# Patient Record
Sex: Male | Born: 1955
Health system: Southern US, Community
[De-identification: ages and names within clinical notes are randomized; demographics above are authoritative.]

## PROBLEM LIST (undated history)

## (undated) DIAGNOSIS — L309 Dermatitis, unspecified: Secondary | ICD-10-CM

## (undated) DIAGNOSIS — Z720 Tobacco use: Secondary | ICD-10-CM

## (undated) DIAGNOSIS — J449 Chronic obstructive pulmonary disease, unspecified: Secondary | ICD-10-CM

## (undated) DIAGNOSIS — F819 Developmental disorder of scholastic skills, unspecified: Secondary | ICD-10-CM

## (undated) DIAGNOSIS — K716 Toxic liver disease with hepatitis, not elsewhere classified: Secondary | ICD-10-CM

## (undated) DIAGNOSIS — G4730 Sleep apnea, unspecified: Secondary | ICD-10-CM

## (undated) DIAGNOSIS — T50905A Adverse effect of unspecified drugs, medicaments and biological substances, initial encounter: Secondary | ICD-10-CM

## (undated) DIAGNOSIS — E785 Hyperlipidemia, unspecified: Secondary | ICD-10-CM

## (undated) DIAGNOSIS — I10 Essential (primary) hypertension: Secondary | ICD-10-CM

## (undated) HISTORY — DX: Dermatitis, unspecified: L30.9

## (undated) HISTORY — DX: Adverse effect of unspecified drugs, medicaments and biological substances, initial encounter: T50.905A

## (undated) HISTORY — DX: Sleep apnea, unspecified: G47.30

## (undated) HISTORY — DX: Tobacco use: Z72.0

## (undated) HISTORY — DX: Chronic obstructive pulmonary disease, unspecified: J44.9

## (undated) HISTORY — DX: Developmental disorder of scholastic skills, unspecified: F81.9

## (undated) HISTORY — DX: Toxic liver disease with hepatitis, not elsewhere classified: K71.6

## (undated) HISTORY — DX: Essential (primary) hypertension: I10

## (undated) HISTORY — DX: Hyperlipidemia, unspecified: E78.5

---

## 1999-04-12 ENCOUNTER — Emergency Department (HOSPITAL_COMMUNITY): Admission: EM | Admit: 1999-04-12 | Discharge: 1999-04-12 | Payer: Self-pay | Admitting: Emergency Medicine

## 1999-04-12 ENCOUNTER — Encounter: Payer: Self-pay | Admitting: Emergency Medicine

## 1999-10-25 ENCOUNTER — Emergency Department (HOSPITAL_COMMUNITY): Admission: EM | Admit: 1999-10-25 | Discharge: 1999-10-25 | Payer: Self-pay | Admitting: Emergency Medicine

## 2001-01-02 ENCOUNTER — Emergency Department (HOSPITAL_COMMUNITY): Admission: EM | Admit: 2001-01-02 | Discharge: 2001-01-02 | Payer: Self-pay | Admitting: Emergency Medicine

## 2001-01-18 ENCOUNTER — Emergency Department (HOSPITAL_COMMUNITY): Admission: EM | Admit: 2001-01-18 | Discharge: 2001-01-18 | Payer: Self-pay | Admitting: Emergency Medicine

## 2001-01-29 ENCOUNTER — Encounter: Admission: RE | Admit: 2001-01-29 | Discharge: 2001-01-29 | Payer: Self-pay | Admitting: Sports Medicine

## 2003-04-14 ENCOUNTER — Emergency Department (HOSPITAL_COMMUNITY): Admission: EM | Admit: 2003-04-14 | Discharge: 2003-04-14 | Payer: Self-pay | Admitting: Emergency Medicine

## 2003-04-27 ENCOUNTER — Encounter: Admission: RE | Admit: 2003-04-27 | Discharge: 2003-04-27 | Payer: Self-pay | Admitting: Internal Medicine

## 2003-05-18 ENCOUNTER — Encounter: Admission: RE | Admit: 2003-05-18 | Discharge: 2003-05-18 | Payer: Self-pay | Admitting: Internal Medicine

## 2003-05-18 ENCOUNTER — Ambulatory Visit (HOSPITAL_COMMUNITY): Admission: RE | Admit: 2003-05-18 | Discharge: 2003-05-18 | Payer: Self-pay | Admitting: Internal Medicine

## 2003-06-18 ENCOUNTER — Encounter: Admission: RE | Admit: 2003-06-18 | Discharge: 2003-06-18 | Payer: Self-pay | Admitting: Internal Medicine

## 2003-07-15 ENCOUNTER — Encounter: Admission: RE | Admit: 2003-07-15 | Discharge: 2003-07-15 | Payer: Self-pay | Admitting: Internal Medicine

## 2003-10-28 ENCOUNTER — Encounter: Admission: RE | Admit: 2003-10-28 | Discharge: 2003-10-28 | Payer: Self-pay | Admitting: Internal Medicine

## 2003-11-10 ENCOUNTER — Emergency Department (HOSPITAL_COMMUNITY): Admission: AD | Admit: 2003-11-10 | Discharge: 2003-11-10 | Payer: Self-pay | Admitting: Family Medicine

## 2003-11-18 ENCOUNTER — Encounter: Admission: RE | Admit: 2003-11-18 | Discharge: 2003-11-18 | Payer: Self-pay | Admitting: Internal Medicine

## 2003-12-04 ENCOUNTER — Encounter: Admission: RE | Admit: 2003-12-04 | Discharge: 2003-12-04 | Payer: Self-pay | Admitting: Internal Medicine

## 2004-04-14 ENCOUNTER — Encounter: Admission: RE | Admit: 2004-04-14 | Discharge: 2004-04-14 | Payer: Self-pay | Admitting: Internal Medicine

## 2004-04-14 ENCOUNTER — Ambulatory Visit (HOSPITAL_COMMUNITY): Admission: RE | Admit: 2004-04-14 | Discharge: 2004-04-14 | Payer: Self-pay | Admitting: Internal Medicine

## 2004-06-14 ENCOUNTER — Encounter: Admission: RE | Admit: 2004-06-14 | Discharge: 2004-06-14 | Payer: Self-pay | Admitting: Internal Medicine

## 2004-06-14 ENCOUNTER — Ambulatory Visit (HOSPITAL_COMMUNITY): Admission: RE | Admit: 2004-06-14 | Discharge: 2004-06-14 | Payer: Self-pay | Admitting: Internal Medicine

## 2004-06-15 ENCOUNTER — Encounter: Admission: RE | Admit: 2004-06-15 | Discharge: 2004-06-15 | Payer: Self-pay | Admitting: Internal Medicine

## 2004-06-21 IMAGING — CR DG HIP W/ PELVIS BILAT
5 series · 5 of 5 positions shown · non-contrast
Comparison: none

CLINICAL DATA: 47-year-old male with bilateral hip pain, right greater than left.  No known injury. 
 BILATERAL HIPS WITH PELVIS (FIVE VIEWS)

[view not recorded (1 of 5)]
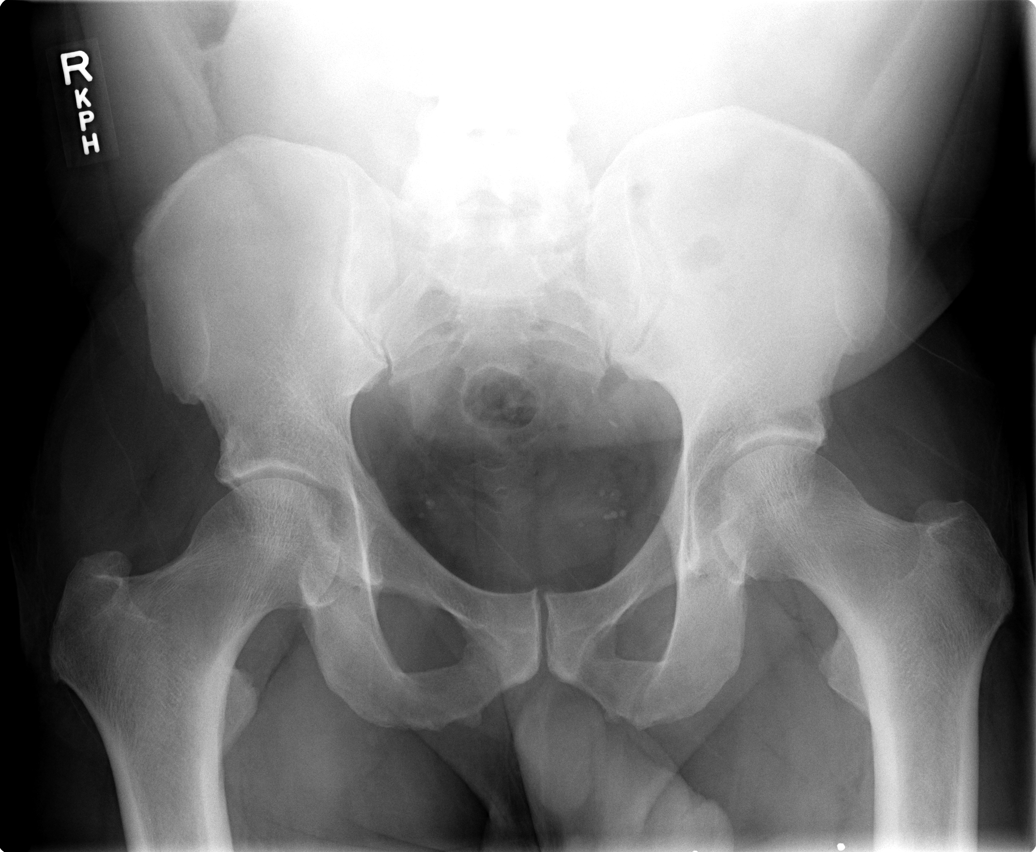

[view not recorded (2 of 5)]
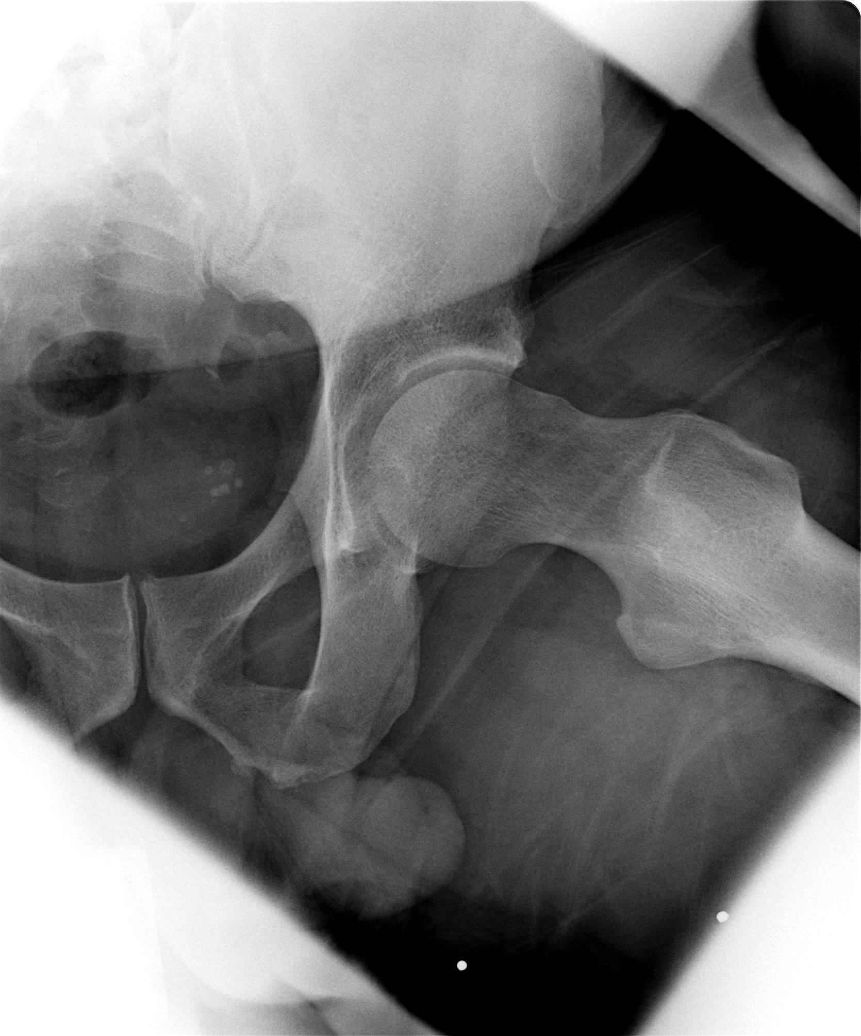

[view not recorded (3 of 5)]
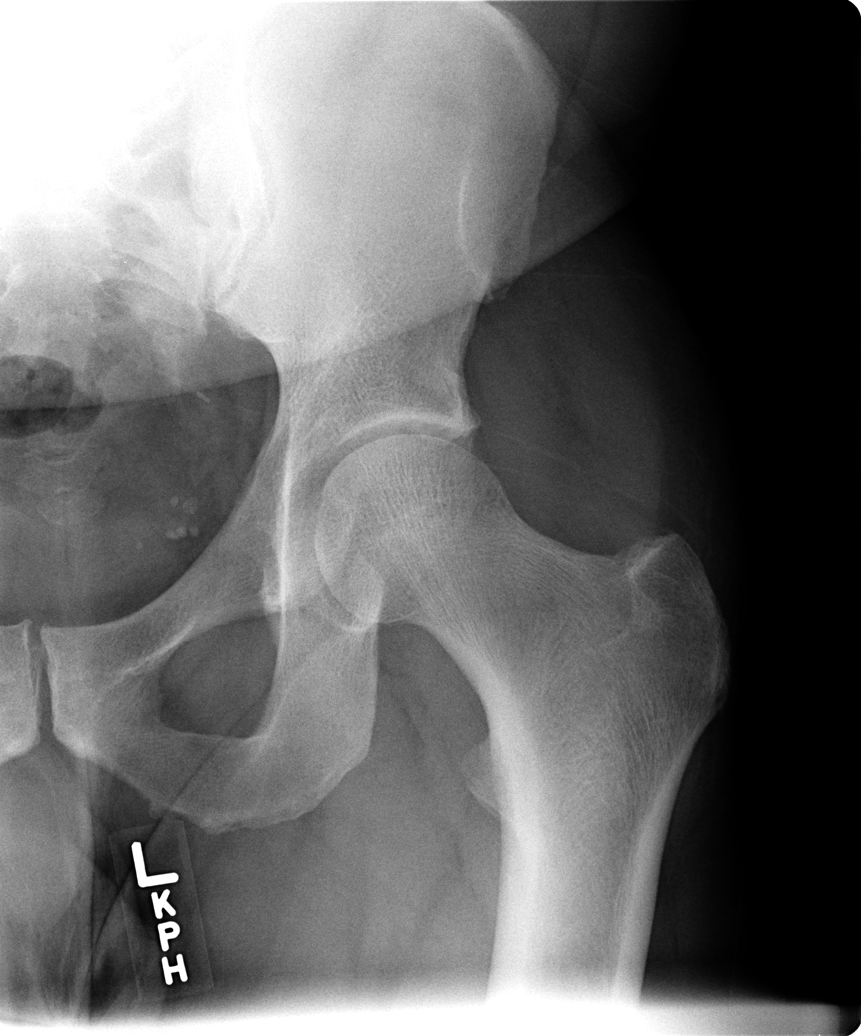

[view not recorded (4 of 5)]
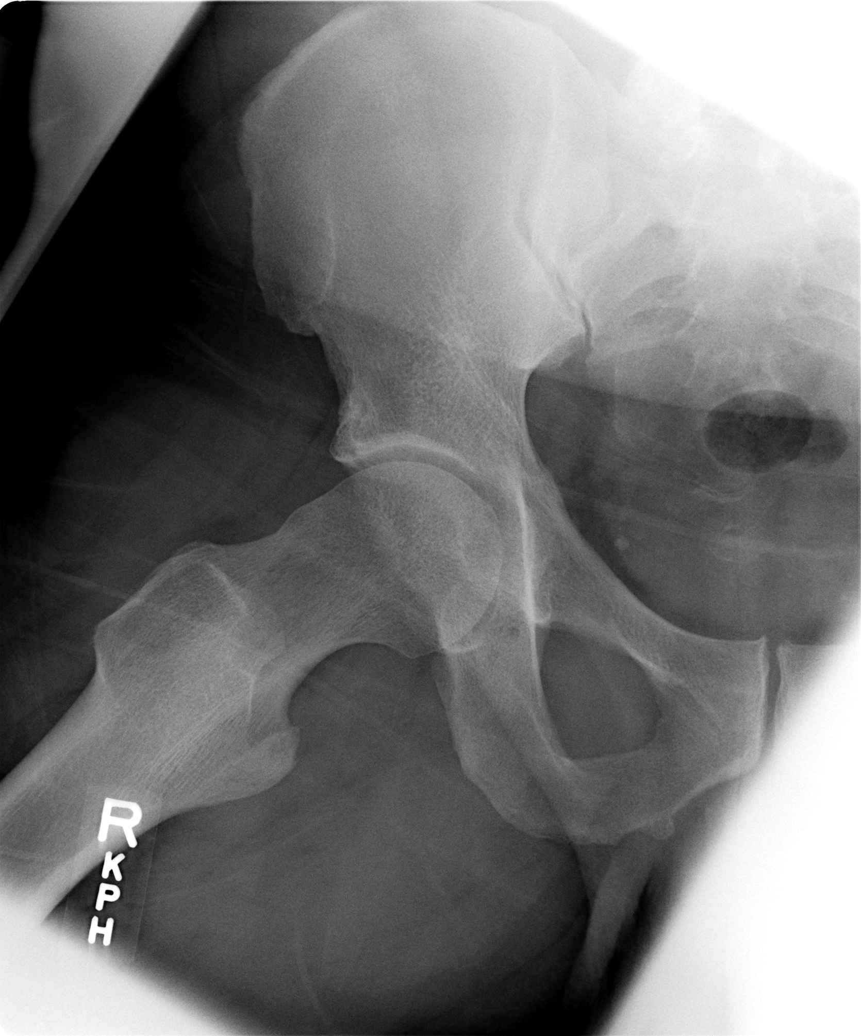

[view not recorded (5 of 5)]
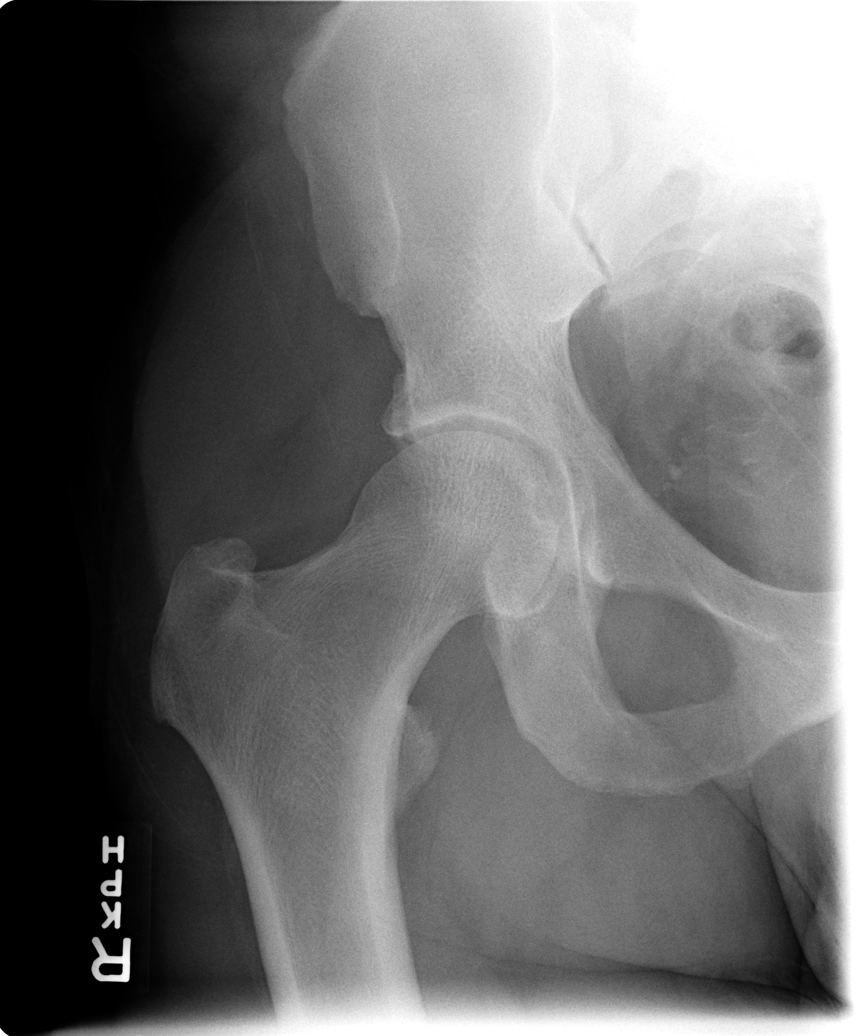

[5 of 5 positions shown; findings below may reference images not displayed]

FINDINGS: Both hips are located and appear symmetric.  No acute fracture or malalignment.  Trabecular pattern appears symmetric.  Femoral contours are uniform and smooth. 
 Rami are intact.  Symphysis is aligned.  SI joints are symmetric. 
 IMPRESSION
 No acute finding by plain radiography.

## 2004-07-14 ENCOUNTER — Ambulatory Visit (HOSPITAL_BASED_OUTPATIENT_CLINIC_OR_DEPARTMENT_OTHER): Admission: RE | Admit: 2004-07-14 | Discharge: 2004-07-14 | Payer: Self-pay | Admitting: Internal Medicine

## 2004-07-18 ENCOUNTER — Encounter: Admission: RE | Admit: 2004-07-18 | Discharge: 2004-07-18 | Payer: Self-pay | Admitting: Internal Medicine

## 2004-09-22 ENCOUNTER — Ambulatory Visit: Payer: Self-pay | Admitting: Internal Medicine

## 2004-10-26 ENCOUNTER — Ambulatory Visit: Payer: Self-pay | Admitting: Internal Medicine

## 2005-03-29 ENCOUNTER — Ambulatory Visit: Payer: Self-pay | Admitting: Internal Medicine

## 2005-04-12 ENCOUNTER — Ambulatory Visit (HOSPITAL_COMMUNITY): Admission: RE | Admit: 2005-04-12 | Discharge: 2005-04-12 | Payer: Self-pay | Admitting: Internal Medicine

## 2005-04-12 ENCOUNTER — Ambulatory Visit: Payer: Self-pay | Admitting: Internal Medicine

## 2005-04-13 ENCOUNTER — Ambulatory Visit (HOSPITAL_BASED_OUTPATIENT_CLINIC_OR_DEPARTMENT_OTHER): Admission: RE | Admit: 2005-04-13 | Discharge: 2005-04-13 | Payer: Self-pay | Admitting: Internal Medicine

## 2005-04-16 ENCOUNTER — Ambulatory Visit: Payer: Self-pay | Admitting: Internal Medicine

## 2005-04-19 ENCOUNTER — Emergency Department (HOSPITAL_COMMUNITY): Admission: EM | Admit: 2005-04-19 | Discharge: 2005-04-19 | Payer: Self-pay | Admitting: Emergency Medicine
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
## 2005-05-30 ENCOUNTER — Ambulatory Visit: Payer: Self-pay | Admitting: Internal Medicine

## 2005-09-01 ENCOUNTER — Ambulatory Visit: Payer: Self-pay | Admitting: Internal Medicine

## 2005-12-01 ENCOUNTER — Ambulatory Visit: Payer: Self-pay | Admitting: Internal Medicine

## 2006-09-03 ENCOUNTER — Ambulatory Visit: Payer: Self-pay | Admitting: Internal Medicine

## 2006-09-03 ENCOUNTER — Ambulatory Visit (HOSPITAL_COMMUNITY): Admission: RE | Admit: 2006-09-03 | Discharge: 2006-09-03 | Payer: Self-pay | Admitting: Internal Medicine

## 2006-09-10 ENCOUNTER — Ambulatory Visit: Payer: Self-pay | Admitting: Hospitalist

## 2006-09-24 ENCOUNTER — Ambulatory Visit: Payer: Self-pay | Admitting: Hospitalist

## 2006-10-01 DIAGNOSIS — J439 Emphysema, unspecified: Secondary | ICD-10-CM

## 2006-10-01 DIAGNOSIS — F17200 Nicotine dependence, unspecified, uncomplicated: Secondary | ICD-10-CM

## 2006-10-01 DIAGNOSIS — G4730 Sleep apnea, unspecified: Secondary | ICD-10-CM | POA: Insufficient documentation

## 2006-10-01 DIAGNOSIS — I10 Essential (primary) hypertension: Secondary | ICD-10-CM | POA: Insufficient documentation

## 2006-10-01 DIAGNOSIS — L309 Dermatitis, unspecified: Secondary | ICD-10-CM

## 2006-10-01 DIAGNOSIS — E785 Hyperlipidemia, unspecified: Secondary | ICD-10-CM

## 2006-10-01 DIAGNOSIS — L259 Unspecified contact dermatitis, unspecified cause: Secondary | ICD-10-CM | POA: Insufficient documentation

## 2006-10-01 DIAGNOSIS — J449 Chronic obstructive pulmonary disease, unspecified: Secondary | ICD-10-CM

## 2006-10-01 HISTORY — DX: Hyperlipidemia, unspecified: E78.5

## 2006-10-01 HISTORY — DX: Essential (primary) hypertension: I10

## 2006-10-01 HISTORY — DX: Dermatitis, unspecified: L30.9

## 2006-10-01 HISTORY — DX: Sleep apnea, unspecified: G47.30

## 2006-10-01 HISTORY — DX: Chronic obstructive pulmonary disease, unspecified: J44.9

## 2007-01-07 DIAGNOSIS — Z720 Tobacco use: Secondary | ICD-10-CM

## 2007-01-07 DIAGNOSIS — F819 Developmental disorder of scholastic skills, unspecified: Secondary | ICD-10-CM

## 2007-01-07 DIAGNOSIS — M545 Low back pain: Secondary | ICD-10-CM

## 2007-01-07 HISTORY — DX: Developmental disorder of scholastic skills, unspecified: F81.9

## 2007-01-07 HISTORY — DX: Tobacco use: Z72.0

## 2007-03-01 ENCOUNTER — Telehealth: Payer: Self-pay | Admitting: *Deleted

## 2007-03-12 ENCOUNTER — Telehealth: Payer: Self-pay | Admitting: *Deleted

## 2007-04-19 ENCOUNTER — Telehealth: Payer: Self-pay | Admitting: *Deleted

## 2007-05-02 ENCOUNTER — Telehealth (INDEPENDENT_AMBULATORY_CARE_PROVIDER_SITE_OTHER): Payer: Self-pay | Admitting: *Deleted

## 2007-05-07 ENCOUNTER — Telehealth (INDEPENDENT_AMBULATORY_CARE_PROVIDER_SITE_OTHER): Payer: Self-pay | Admitting: Internal Medicine

## 2007-06-21 ENCOUNTER — Telehealth (INDEPENDENT_AMBULATORY_CARE_PROVIDER_SITE_OTHER): Payer: Self-pay | Admitting: *Deleted

## 2007-06-21 DIAGNOSIS — K716 Toxic liver disease with hepatitis, not elsewhere classified: Secondary | ICD-10-CM

## 2007-06-21 DIAGNOSIS — T50905A Adverse effect of unspecified drugs, medicaments and biological substances, initial encounter: Secondary | ICD-10-CM

## 2007-06-21 HISTORY — DX: Adverse effect of unspecified drugs, medicaments and biological substances, initial encounter: T50.905A

## 2007-06-21 HISTORY — DX: Toxic liver disease with hepatitis, not elsewhere classified: K71.6

## 2007-07-15 ENCOUNTER — Encounter (INDEPENDENT_AMBULATORY_CARE_PROVIDER_SITE_OTHER): Payer: Self-pay | Admitting: *Deleted

## 2007-07-15 ENCOUNTER — Ambulatory Visit: Payer: Self-pay | Admitting: *Deleted

## 2007-07-17 ENCOUNTER — Encounter (INDEPENDENT_AMBULATORY_CARE_PROVIDER_SITE_OTHER): Payer: Self-pay | Admitting: *Deleted

## 2007-07-17 LAB — CONVERTED CEMR LAB
ALT: 24 units/L (ref 0–53)
AST: 22 units/L (ref 0–37)
Albumin: 4.4 g/dL (ref 3.5–5.2)
Alkaline Phosphatase: 91 units/L (ref 39–117)
BUN: 13 mg/dL (ref 6–23)
CO2: 23 meq/L (ref 19–32)
Calcium: 9.6 mg/dL (ref 8.4–10.5)
Chloride: 103 meq/L (ref 96–112)
Creatinine, Ser: 0.98 mg/dL (ref 0.40–1.50)
Glucose, Bld: 102 mg/dL — ABNORMAL HIGH (ref 70–99)
Potassium: 4.4 meq/L (ref 3.5–5.3)
Sodium: 137 meq/L (ref 135–145)
Total Bilirubin: 0.3 mg/dL (ref 0.3–1.2)
Total Protein: 7.5 g/dL (ref 6.0–8.3)

## 2007-07-18 ENCOUNTER — Encounter (INDEPENDENT_AMBULATORY_CARE_PROVIDER_SITE_OTHER): Payer: Self-pay | Admitting: *Deleted

## 2007-07-18 ENCOUNTER — Ambulatory Visit: Payer: Self-pay | Admitting: Internal Medicine

## 2007-07-22 ENCOUNTER — Ambulatory Visit (HOSPITAL_COMMUNITY): Admission: RE | Admit: 2007-07-22 | Discharge: 2007-07-22 | Payer: Self-pay | Admitting: Internal Medicine

## 2007-07-22 LAB — CONVERTED CEMR LAB
Cholesterol: 221 mg/dL — ABNORMAL HIGH (ref 0–200)
HDL: 39 mg/dL — ABNORMAL LOW (ref >39)
LDL Cholesterol: 153 mg/dL — ABNORMAL HIGH (ref 0–99)
Total CHOL/HDL Ratio: 5.7
Triglycerides: 147 mg/dL (ref <150)
VLDL: 29 mg/dL (ref 0–40)

## 2007-08-06 ENCOUNTER — Ambulatory Visit: Payer: Self-pay | Admitting: Infectious Disease

## 2007-08-06 ENCOUNTER — Encounter: Payer: Self-pay | Admitting: Licensed Clinical Social Worker

## 2008-01-08 ENCOUNTER — Telehealth (INDEPENDENT_AMBULATORY_CARE_PROVIDER_SITE_OTHER): Payer: Self-pay | Admitting: *Deleted

## 2008-02-03 ENCOUNTER — Encounter (INDEPENDENT_AMBULATORY_CARE_PROVIDER_SITE_OTHER): Payer: Self-pay | Admitting: *Deleted

## 2008-10-12 ENCOUNTER — Emergency Department (HOSPITAL_COMMUNITY): Admission: EM | Admit: 2008-10-12 | Discharge: 2008-10-12 | Payer: Self-pay | Admitting: Emergency Medicine

## 2009-04-01 ENCOUNTER — Encounter (INDEPENDENT_AMBULATORY_CARE_PROVIDER_SITE_OTHER): Payer: Self-pay | Admitting: *Deleted

## 2009-04-01 ENCOUNTER — Ambulatory Visit: Payer: Self-pay | Admitting: Internal Medicine

## 2009-04-01 LAB — CONVERTED CEMR LAB
Alkaline Phosphatase: 107 units/L (ref 39–117)
CO2: 20 meq/L (ref 19–32)
Cholesterol: 227 mg/dL — ABNORMAL HIGH (ref 0–200)
Creatinine, Ser: 1.12 mg/dL (ref 0.40–1.50)
GFR calc Af Amer: >60 mL/min (ref >60)
GFR calc non Af Amer: >60 mL/min (ref >60)
Glucose, Bld: 100 mg/dL — ABNORMAL HIGH (ref 70–99)
HDL: 39 mg/dL — ABNORMAL LOW (ref >39)
Sodium: 144 meq/L (ref 135–145)
Total Bilirubin: 0.4 mg/dL (ref 0.3–1.2)
Total CHOL/HDL Ratio: 5.8
Total Protein: 7.8 g/dL (ref 6.0–8.3)
Triglycerides: 180 mg/dL — ABNORMAL HIGH (ref <150)

## 2009-04-05 ENCOUNTER — Telehealth (INDEPENDENT_AMBULATORY_CARE_PROVIDER_SITE_OTHER): Payer: Self-pay | Admitting: *Deleted

## 2009-05-03 ENCOUNTER — Ambulatory Visit: Payer: Self-pay | Admitting: *Deleted

## 2009-10-05 ENCOUNTER — Telehealth: Payer: Self-pay | Admitting: Internal Medicine

## 2010-01-28 ENCOUNTER — Telehealth: Payer: Self-pay | Admitting: Internal Medicine

## 2010-03-03 ENCOUNTER — Telehealth: Payer: Self-pay | Admitting: Internal Medicine

## 2010-03-28 ENCOUNTER — Telehealth: Payer: Self-pay | Admitting: Internal Medicine

## 2010-03-29 ENCOUNTER — Ambulatory Visit: Payer: Self-pay | Admitting: Internal Medicine

## 2010-03-29 LAB — CONVERTED CEMR LAB
Alkaline Phosphatase: 118 units/L — ABNORMAL HIGH (ref 39–117)
BUN: 13 mg/dL (ref 6–23)
CO2: 25 meq/L (ref 19–32)
Cholesterol: 170 mg/dL (ref 0–200)
Creatinine, Ser: 1.12 mg/dL (ref 0.40–1.50)
Glucose, Bld: 101 mg/dL — ABNORMAL HIGH (ref 70–99)
HDL: 48 mg/dL (ref >39)
LDL Cholesterol: 112 mg/dL — ABNORMAL HIGH (ref 0–99)
Total Bilirubin: 0.5 mg/dL (ref 0.3–1.2)
Total Protein: 7.9 g/dL (ref 6.0–8.3)
Triglycerides: 51 mg/dL (ref <150)
VLDL: 10 mg/dL (ref 0–40)

## 2010-04-01 ENCOUNTER — Telehealth: Payer: Self-pay | Admitting: Internal Medicine

## 2010-04-18 ENCOUNTER — Telehealth: Payer: Self-pay | Admitting: *Deleted

## 2010-04-25 ENCOUNTER — Telehealth: Payer: Self-pay | Admitting: Internal Medicine

## 2010-05-02 ENCOUNTER — Telehealth: Payer: Self-pay | Admitting: Internal Medicine

## 2010-09-12 ENCOUNTER — Telehealth: Payer: Self-pay | Admitting: Internal Medicine

## 2010-11-28 ENCOUNTER — Telehealth: Payer: Self-pay | Admitting: Internal Medicine

## 2011-01-24 NOTE — Progress Notes (Signed)
Summary: med refill/gp  Phone Note Refill Request Message from:  Fax from Pharmacy on May 02, 2010 11:39 AM  Refills Requested: Medication #1:  NORVASC 10 MG TABS Take 1 tablet by mouth once a day   Last Refilled: 04/01/2010  Method Requested: Electronic Initial call taken by: Chinita Pester RN,  May 02, 2010 11:39 AM    Prescriptions: NORVASC 10 MG TABS (AMLODIPINE BESYLATE) Take 1 tablet by mouth once a day  #30 x 11   Entered and Authorized by:   Darnelle Maffucci MD   Signed by:   Darnelle Maffucci MD on 05/04/2010   Method used:   [facility]        [facility] [address] [phone]* (retail)       [address]       [place], [place]  [address]       Ph: [phone] or [phone]       Fax: [phone]   RxID:   [id number]

## 2011-01-24 NOTE — Progress Notes (Signed)
Summary: Refill/gh  Phone Note Refill Request Message from:  Fax from Pharmacy on January 28, 2010 10:17 AM  Refills Requested: Medication #1:  ADVAIR DISKUS 250-50 MCG/DOSE MISC Inhale 2 puffs two times a day   Last Refilled: 12/28/2009  Method Requested: Electronic Initial call taken by: Angelina Ok RN,  January 28, 2010 10:17 AM    Prescriptions: ADVAIR DISKUS 250-50 MCG/DOSE MISC (FLUTICASONE-SALMETEROL) Inhale 2 puffs two times a day  #1 x 11   Entered and Authorized by:   Laren Everts MD   Signed by:   Laren Everts MD on 01/28/2010   Method used:   [facility]        [facility] [address] [phone]* (retail)       [address]       [place], [place]  [address]       Ph: [phone] or [phone]       Fax: [phone]   RxID:   [id number]

## 2011-01-24 NOTE — Progress Notes (Signed)
Summary: med refill/gp  Phone Note Refill Request Message from:  Fax from Pharmacy on March 28, 2010 12:22 PM  Refills Requested: Medication #1:  PRAVASTATIN SODIUM 80 MG TABS Take 1 tablet by mouth at bedtime   Last Refilled: 02/24/2010 Pt. has an appt. with you tomorrow.   Method Requested: Electronic Initial call taken by: Chinita Pester RN,  March 28, 2010 12:22 PM  Follow-up for Phone Call       Follow-up by: Darnelle Maffucci MD,  March 28, 2010 5:50 PM    Prescriptions: PRAVASTATIN SODIUM 80 MG TABS (PRAVASTATIN SODIUM) Take 1 tablet by mouth at bedtime  #30 x 5   Entered and Authorized by:   Darnelle Maffucci MD   Signed by:   Darnelle Maffucci MD on 03/28/2010   Method used:   [facility]ron[facility] to        [facility] [address] [phone]* (retail)       [address]       [place], [place]  [address]       Ph: [phone] or [phone]       Fax: [phone]   RxID:   [id number]

## 2011-01-24 NOTE — Assessment & Plan Note (Signed)
Summary: RA/NEEDS ROUTINE CHECKUP/CH   Vital Signs:  Patient profile:   55 year old male Height:      71 inches (180.34 cm) Weight:      219.7 pounds (99.86 kg) BMI:     30.75 Temp:     97.9 degrees F (36.61 degrees C) oral Pulse rate:   90 / minute BP sitting:   141 / 95  (right arm)  Vitals Entered By: Cynda Familia Duncan Dull) (March 29, 2010 9:47 AM) CC: routine f/u//kg Is Patient Diabetic? No Pain Assessment Patient in pain? no      Nutritional Status BMI of > 30 = obese  Have you ever been in a relationship where you felt threatened, hurt or afraid?No   Does patient need assistance? Functional Status Self care Ambulation Normal   Primary Care Provider:  Reynold Bowen  CC:  routine f/u//kg.  History of Present Illness: Mr. Kardell is a 55 y/o man with HTN, mild mental retardation and HLD who presents to the opc to f/u on his HTN.  here for checkup since he has not been here for a few months. does not complain of anything.  Patient denies fever, chills, CP, SOB, N,V,C,D. Otherwise doing well, and denies any other complaints.        Preventive Screening-Counseling & Management  Alcohol-Tobacco     Smoking Status: current     Smoking Cessation Counseling: yes     Packs/Day: 1/ 2     Year Started: at the age of  24  Current Medications (verified): 1)  Dyazide 37.5-25 Mg Caps (Triamterene-Hctz) .... Take 1 Tablet By Mouth Once A Day 2)  Norvasc 10 Mg Tabs (Amlodipine Besylate) .... Take 1 Tablet By Mouth Once A Day 3)  Ventolin Hfa 108 (90 Base) Mcg/act Aers (Albuterol Sulfate) .... 2 Puffs Inhaled Every 6 Hours As Needed For Shortness of Breath 4)  Toprol Xl 100 Mg Tb24 (Metoprolol Succinate) .... Take 1 1/2 Tablet By Mouth Once A Day 5)  Cozaar 100 Mg Tabs (Losartan Potassium) .... Take 1 Tablet By Mouth Once A Day 6)  Vistaril 25 Mg Caps (Hydroxyzine Pamoate) .... Take 1 Tablet By Mouth At Bedtime 7)  Advair Diskus 250-50 Mcg/dose Misc (Fluticasone-Salmeterol)  .... Inhale 2 Puffs Two Times A Day 8)  Pravastatin Sodium 80 Mg Tabs (Pravastatin Sodium) .... Take 1 Tablet By Mouth At Bedtime 9)  Atrovent Hfa 17 Mcg/act  Aers (Ipratropium Bromide Hfa) .... Take 2 Puffs Every 4 Hours As Needed For Shortness of Breath 10)  Feldene 10 Mg  Caps (Piroxicam) .... Take 2 Tabs By Mouth Once Daily  Allergies: 1)  ! Ace Inhibitors  Review of Systems       Per HPI  Physical Exam  General:  alert, well-developed, and cooperative to examination.    Lungs:  normal respiratory effort, no accessory muscle use, normal breath sounds, no crackles, and no wheezes.  Heart:  normal rate, regular rhythm, no murmur, no gallop, and no rub.    Abdomen:  soft, non-tender, normal bowel sounds, no distention, no guarding, no rebound tenderness, no hepatomegaly, and no splenomegaly.    Msk:  no joint swelling, no joint warmth, and no redness over joints.    Extremities:  No cyanosis, clubbing, edema  Neurologic:  alert & oriented X3, cranial nerves II-XII intact, strength normal in all extremities, sensation intact to light touch, and gait normal.     Skin:   turgor normal and no rashes.  Psych:  Oriented  X3, memory intact for recent and remote, normally interactive, good eye contact, not anxious appearing, and not depressed appearing.    Impression & Recommendations:  Problem # 1:  HYPERLIPIDEMIA (ICD-272.4) Assessment Comment Only Last LDL 151, will recheck FLP, if LDL remains >100 will change pravastatin to simvastatin (as it is a more potent statin)  His updated medication list for this problem includes:    Pravastatin Sodium 80 Mg Tabs (Pravastatin sodium) .Marland Kitchen... Take 1 tablet by mouth at bedtime  Orders: T-Lipid Profile ([id number])  Problem # 2:  HEPATOTOXICITY, DRUG-INDUCED, RISK OF (ICD-V58.69) Assessment: Comment Only appears to have resolved, will check CMET to make sure.   Problem # 3:  MENTAL RETARDATION, MILD (ICD-317) Assessment: Comment Only at  baseline  Problem # 4:  HYPERTENSION (ICD-401.9) Assessment: Improved On meds listed below, well controlled on current rx, No further changes made today, will monitor.   His updated medication list for this problem includes:    Dyazide 37.5-25 Mg Caps (Triamterene-hctz) .Marland Kitchen... Take 1 tablet by mouth once a day    Norvasc 10 Mg Tabs (Amlodipine besylate) .Marland Kitchen... Take 1 tablet by mouth once a day    Toprol Xl 100 Mg Tb24 (Metoprolol succinate) .Marland Kitchen... Take 1 1/2 tablet by mouth once a day    Cozaar 100 Mg Tabs (Losartan potassium) .Marland Kitchen... Take 1 tablet by mouth once a day  Problem # 5:  COPD (ICD-496) No wheezing on exam, breathing at baseline, well controlled on current rx.   His updated medication list for this problem includes:    Ventolin Hfa 108 (90 Base) Mcg/act Aers (Albuterol sulfate) .Marland Kitchen... 2 puffs inhaled every 6 hours as needed for shortness of breath    Advair Diskus 250-50 Mcg/dose Misc (Fluticasone-salmeterol) ..... Inhale 2 puffs two times a day    Atrovent Hfa 17 Mcg/act Aers (Ipratropium bromide hfa) .Marland Kitchen... Take 2 puffs every 4 hours as needed for shortness of breath  Complete Medication List: 1)  Dyazide 37.5-25 Mg Caps (Triamterene-hctz) .... Take 1 tablet by mouth once a day 2)  Norvasc 10 Mg Tabs (Amlodipine besylate) .... Take 1 tablet by mouth once a day 3)  Ventolin Hfa 108 (90 Base) Mcg/act Aers (Albuterol sulfate) .... 2 puffs inhaled every 6 hours as needed for shortness of breath 4)  Toprol Xl 100 Mg Tb24 (Metoprolol succinate) .... Take 1 1/2 tablet by mouth once a day 5)  Cozaar 100 Mg Tabs (Losartan potassium) .... Take 1 tablet by mouth once a day 6)  Vistaril 25 Mg Caps (Hydroxyzine pamoate) .... Take 1 tablet by mouth at bedtime 7)  Advair Diskus 250-50 Mcg/dose Misc (Fluticasone-salmeterol) .... Inhale 2 puffs two times a day 8)  Pravastatin Sodium 80 Mg Tabs (Pravastatin sodium) .... Take 1 tablet by mouth at bedtime 9)  Atrovent Hfa 17 Mcg/act Aers (Ipratropium  bromide hfa) .... Take 2 puffs every 4 hours as needed for shortness of breath 10)  Feldene 10 Mg Caps (Piroxicam) .... Take 2 tabs by mouth once daily  Other Orders: T-Comprehensive Metabolic Panel ([id number])  Patient Instructions: 1)  Please schedule a follow-up appointment in 6 months. Process Orders Check Orders Results:     [facility]: ABN not required for this insurance Tests Sent for requisitioning (March 29, 2010 12:18 PM):     03/29/2010: [facility] -- T-Lipid Profile [phone] (signed)     03/29/2010: [facility] -- T-Comprehensive Metabolic Panel [phone] (signed)    Prevention & Chronic Care Immunizations  Influenza vaccine: Historical  (09/24/2006)    Tetanus booster: Not documented    Pneumococcal vaccine: Not documented  Colorectal Screening   Hemoccult: Not documented    Colonoscopy: Not documented   Colonoscopy action/deferral: Deferred  (03/29/2010)  Other Screening   PSA: Not documented   Smoking status: current  (03/29/2010)   Smoking cessation counseling: yes  (03/29/2010)  Lipids   Total Cholesterol: 227  (04/01/2009)   Lipid panel action/deferral: Lipid Panel ordered   LDL: 152  (04/01/2009)   LDL Direct: Not documented   HDL: 39  (04/01/2009)   Triglycerides: 180  (04/01/2009)    SGOT (AST): 23  (04/01/2009)   BMP action: Ordered   SGPT (ALT): 20  (04/01/2009) CMP ordered    Alkaline phosphatase: 107  (04/01/2009)   Total bilirubin: 0.4  (04/01/2009)    Lipid flowsheet reviewed?: Yes   Progress toward LDL goal: Unchanged  Hypertension   Last Blood Pressure: 141 / 95  (03/29/2010)   Serum creatinine: 1.12  (04/01/2009)   BMP action: Ordered   Serum potassium 4.1  (04/01/2009) CMP ordered     Hypertension flowsheet reviewed?: Yes   Progress toward BP goal: At goal  Self-Management Support :   Personal Goals (by the next clinic visit) :      Personal blood pressure  goal: 140/90  (03/29/2010)     Personal LDL goal: 100  (03/29/2010)    Patient will work on the following items until the next clinic visit to reach self-care goals:     Medications and monitoring: take my medicines every day  (03/29/2010)     Eating: eat foods that are low in salt, eat baked foods instead of fried foods  (03/29/2010)     Activity: take a 30 minute walk every day  (03/29/2010)    Hypertension self-management support: Pre-printed educational material, Resources for patients handout, Written self-care plan  (03/29/2010)   Hypertension self-care plan printed.    Lipid self-management support: Pre-printed educational material, Resources for patients handout, Written self-care plan  (03/29/2010)   Lipid self-care plan printed.      Resource handout printed.   Nursing Instructions: Give Flu vaccine today

## 2011-01-24 NOTE — Progress Notes (Signed)
Summary: refill/gg  Phone Note Refill Request  on March 03, 2010 3:44 PM  Refills Requested: Medication #1:  COZAAR 100 MG TABS Take 1 tablet by mouth once a day   Last Refilled: 01/28/2010  Medication #2:  ADVAIR DISKUS 250-50 MCG/DOSE MISC Inhale 2 puffs two times a day   Last Refilled: 12/28/2009  Method Requested: Electronic Initial call taken by: Merrie Roof RN,  March 03, 2010 3:44 PM  Follow-up for Phone Call       Follow-up by: Darnelle Maffucci MD,  March 04, 2010 6:30 AM    Prescriptions: ADVAIR DISKUS 250-50 MCG/DOSE MISC (FLUTICASONE-SALMETEROL) Inhale 2 puffs two times a day  #1 x 11   Entered and Authorized by:   Darnelle Maffucci MD   Signed by:   Darnelle Maffucci MD on 03/04/2010   Method used:   [facility] to        [facility] [address] [phone]* (retail)       [address]       [place], [place]  [address]       Ph: [phone] or [phone]       Fax: [phone]   RxID:   [id number] COZAAR 100 MG TABS (LOSARTAN POTASSIUM) Take 1 tablet by mouth once a day  #30 Tablet x 3   Entered and Authorized by:   Darnelle Maffucci MD   Signed by:   Darnelle Maffucci MD on 03/04/2010   Method used:   [facility]ronically to        [facility] [address] [phone]* (retail)       [address]       [place], [place]  [address]       Ph: [phone] or [phone]       Fax: [phone]   RxID:   [id number]

## 2011-01-24 NOTE — Progress Notes (Signed)
Summary: med refill/gp  Phone Note Refill Request Message from:  Fax from Pharmacy on Apr 25, 2010 9:09 AM  Refills Requested: Medication #1:  DYAZIDE 37.5-25 MG CAPS Take 1 tablet by mouth once a day   Last Refilled: 03/27/2010  Method Requested: Electronic Initial call taken by: Chinita Pester RN,  Apr 25, 2010 9:09 AM  Follow-up for Phone Call       Follow-up by: Darnelle Maffucci MD,  Apr 27, 2010 3:14 PM    Prescriptions: Ilsa Iha 37.5-25 MG CAPS (TRIAMTERENE-HCTZ) Take 1 tablet by mouth once a day  #30 x 11   Entered and Authorized by:   Darnelle Maffucci MD   Signed by:   Darnelle Maffucci MD on 04/27/2010   Method used:   Electronically to        [facility] [address] [phone]* (retail)       [address]       [place], [place]  [address]       Ph: [phone] or [phone]       Fax: [phone]   RxID:   [id number]

## 2011-01-24 NOTE — Progress Notes (Signed)
Summary: Refill/gh  Phone Note Refill Request Message from:  Fax from Pharmacy on November 28, 2010 10:11 AM  Refills Requested: Medication #1:  COZAAR 100 MG TABS Take 1 tablet by mouth once a day   Last Refilled: 10/27/2010  Method Requested: Electronic Initial call taken by: Angelina Ok RN,  November 28, 2010 10:11 AM    Prescriptions: COZAAR 100 MG TABS (LOSARTAN POTASSIUM) Take 1 tablet by mouth once a day  #30 x 3   Entered and Authorized by:   Darnelle Maffucci MD   Signed by:   Darnelle Maffucci MD on 11/28/2010   Method used:   Electronically to        [facility] [address] [phone]* (retail)       [address]       [place], [place]  [address]       Ph: [phone] or [phone]       Fax: [phone]   RxID:   [id number]

## 2011-01-24 NOTE — Progress Notes (Signed)
Summary: Refill/gh  Phone Note Refill Request Message from:  Fax from Pharmacy on September 12, 2010 12:04 PM  Refills Requested: Medication #1:  VENTOLIN HFA 108 (90 BASE) MCG/ACT AERS 2 puffs inhaled every 6 hours as needed for shortness of breath   Last Refilled: 08/22/2010  Method Requested: Electronic Initial call taken by: Angelina Ok RN,  September 12, 2010 12:04 PM    Prescriptions: VENTOLIN HFA 108 (90 BASE) MCG/ACT AERS (ALBUTEROL SULFATE) 2 puffs inhaled every 6 hours as needed for shortness of breath  #1 x 11   Entered and Authorized by:   Darnelle Maffucci MD   Signed by:   Darnelle Maffucci MD on 09/13/2010   Method used:   Electronically to        [facility] [address] [phone]* (retail)       [address]       [place], [place]  [address]       Ph: [phone] or [phone]       Fax: [phone]   RxID:   [id number]

## 2011-01-24 NOTE — Progress Notes (Signed)
Summary: nerve med/ hla  Phone Note Call from Patient   Summary of Call: pt calls and would like something for "sometimes i get too excited...Marland Kitchenupset...i just need something to calm me down" . could you please call something in for him Initial call taken by: Marin Roberts RN,  April 21, 2010 5:18 PM  Follow-up for Phone Call        [facility], can you please make a f/u for him to evalaute his anxiety.  and then we can give him something if indicated.  Follow-up by: Darnelle Maffucci MD,  April 22, 2010 9:22 AM

## 2011-01-24 NOTE — Progress Notes (Signed)
Summary: med refill/gp  Phone Note Refill Request Message from:  Fax from Pharmacy on April 01, 2010 12:28 PM  Refills Requested: Medication #1:  TOPROL XL 100 MG TB24 Take 1 1/2 tablet by mouth once a day   Last Refilled: 03/03/2010  Method Requested: Electronic Initial call taken by: Chinita Pester RN,  April 01, 2010 12:29 PM  Follow-up for Phone Call       Follow-up by: Darnelle Maffucci MD,  April 01, 2010 1:43 PM    Prescriptions: TOPROL XL 100 MG TB24 (METOPROLOL SUCCINATE) Take 1 1/2 tablet by mouth once a day  #45 x 11   Entered and Authorized by:   Darnelle Maffucci MD   Signed by:   Darnelle Maffucci MD on 04/01/2010   Method used:   Electronically to        [facility] [address] [phone]* (retail)       [address]       [place], [place]  [address]       Ph: [phone] or [phone]       Fax: [phone]   RxID:   [id number]

## 2011-03-13 ENCOUNTER — Encounter: Payer: Self-pay | Admitting: Internal Medicine

## 2011-03-20 ENCOUNTER — Other Ambulatory Visit: Payer: Self-pay | Admitting: Internal Medicine

## 2011-03-22 ENCOUNTER — Other Ambulatory Visit: Payer: Self-pay | Admitting: Internal Medicine

## 2011-04-09 ENCOUNTER — Other Ambulatory Visit: Payer: Self-pay | Admitting: Internal Medicine

## 2011-05-12 NOTE — Procedures (Signed)
NAME:  Christopher Thompson, Christopher Thompson NO.:  [id number]   MEDICAL RECORD NO.:  [id number]          PATIENT TYPE:  OUT   LOCATION:  SLEEP CENTER                 FACILITY:  [facility]   PHYSICIAN:  Clinton D. Maple Hudson, M.D. DATE OF BIRTH:  19-Nov-1956   DATE OF ADMISSION:  07/14/2004  DATE OF DISCHARGE:  07/14/2004                              NOCTURNAL POLYSOMNOGRAM   REFERRING PHYSICIAN:  Dr. Catalina Pizza   INDICATION FOR STUDY/HISTORY OF PRESENT ILLNESS:  Hypersomnia with sleep  apnea, difficulty initiating and maintaining sleep, daytime somnolence.  Epworth sleepiness score 13/24.  Neck size 18.5 inches.  BMI 30.7.  Weight  220 pounds.  Listed medications included ibuprofen, Norvasc, Zocor,  metoprolol, hydroxyzine, HCTZ, famotidine, albuterol, Atrovent inhaler,  triamcinolone.   SLEEP ARCHITECTURE:  Total sleep time was 313 minutes with sleep deficiency  of 28%.  Stage 1 was 6%.  Stage 2 83%.  Stages 3 and 4 absent.  REM was 11%  of total sleep time.  Latency to sleep onset 35 minutes.  Latency to REM 204  minutes.  Waking after sleep onset 83 minutes.  Arousal index 0.2.   RESPIRATORY DATA:  Mild obstructive sleep apnea/hypopnea syndrome.  RDI 15.7  per hour reflecting 50 hypopneas and 31 obstructive apneas.  Events were not  positional.  REM RDI was 90 per hour.  Split study protocol could not be  done because of late development of obstructive events not allowing  sufficient time for CPAP titration.   OXYGEN DATA:  Mild snoring with mild to moderate oxygen desaturation.  Oxygen saturation nadir was 76%.  Mean oxygen saturation through the night  was 89% with baseline resting room air saturation while awake 96%.   CARDIAC DATA:  Normal cardiac rhythm.   MOVEMENT/PARASOMNIA:  Insignificant occasional leg jerk.   IMPRESSION/RECOMMENDATION:  Mild obstructive sleep apnea/hypopnea syndrome  with mild to moderate oxygen desaturation.  Consider return for CPAP  titration or evaluate for  alternative therapy if appropriate.                                   ______________________________                                Rennis Chris. Maple Hudson, M.D.                                Diplomate, American Board of Sleep Medicine    CDY/MEDQ  D:  07/17/2004 14:55:46  T:  07/18/2004 12:17:43  Job:  [id number]

## 2011-05-12 NOTE — Procedures (Signed)
NAME:  Christopher Thompson, Christopher Thompson NO.:  [id number]   MEDICAL RECORD NO.:  [id number]          PATIENT TYPE:  OUT   LOCATION:  SLEEP CENTER                 FACILITY:  [facility]   PHYSICIAN:  Clinton D. Maple Hudson, M.D. DATE OF BIRTH:  01-09-1956   DATE OF STUDY:  04/13/2005                              NOCTURNAL POLYSOMNOGRAM   STUDY DATE:  April 13, 2005   REFERRING PHYSICIAN:  Dr. Catalina Pizza   INDICATION FOR STUDY:  Hypersomnia with sleep apnea.  Epworth Sleepiness  Score 9/24, BMI 31, weight 220 pounds.   SLEEP ARCHITECTURE:  Short total sleep time 250 minutes with sleep  efficiency 50%.  Stage I was 8%, stage II 76%, stages III and IV were  absent, REM was 16% of total sleep time.  Sleep latency 133 minutes, REM  latency 56 minutes, awake after sleep onset 117 minutes, arousal index 20.   RESPIRATORY DATA:  CPAP titration protocol.  CPAP was titrated to 11 CWP,  RDI 0 per hour using a large ResMed Ultra Mirage Full Face Mask with heated  humidifier.   OXYGEN DATA:  Snoring was eliminated by CPAP.  Oxygen saturation on CPAP  held 90-96% on room air.   CARDIAC DATA:  Normal sinus rhythm.   MOVEMENT/PARASOMNIA:  Occasional leg jerk, insignificant.  No bathroom  trips.   IMPRESSION/RECOMMENDATION:  1.  Successful continuous positive airway pressure titration to 11 CWP,      respiratory disturbance index 0 per hour using a large ResMed Ultra      Mirage Full Face Mask with heated humidifier.  2.  Original baseline diagnostic NPSG on February 15, 2004 recorded      respiratory disturbance index of 15.7 per hour.  3.  Patient had difficulty maintaining sleep on this study night and is      likely to benefit from use of a sleep medication at least during the      first week or two of adjustment to home continuous positive airway      pressure.     CDY/MEDQ  D:  04/16/2005 11:58:17  T:  04/16/2005 13:14:33  Job:  [id number]

## 2011-05-15 ENCOUNTER — Other Ambulatory Visit: Payer: Self-pay | Admitting: *Deleted

## 2011-05-15 DIAGNOSIS — I10 Essential (primary) hypertension: Secondary | ICD-10-CM

## 2011-05-16 MED ORDER — AMLODIPINE BESYLATE 10 MG PO TABS: 10.0000 mg | ORAL_TABLET | Freq: Every day | ORAL | Status: DC

## 2011-06-21 ENCOUNTER — Other Ambulatory Visit: Payer: Self-pay | Admitting: *Deleted

## 2011-06-21 MED ORDER — PRAVASTATIN SODIUM 80 MG PO TABS: 80.0000 mg | ORAL_TABLET | Freq: Every day | ORAL | Status: DC

## 2011-06-26 ENCOUNTER — Encounter: Payer: Self-pay | Admitting: Internal Medicine

## 2011-06-26 ENCOUNTER — Ambulatory Visit (INDEPENDENT_AMBULATORY_CARE_PROVIDER_SITE_OTHER): Payer: Medicaid Other | Admitting: Internal Medicine

## 2011-06-26 DIAGNOSIS — F17200 Nicotine dependence, unspecified, uncomplicated: Secondary | ICD-10-CM

## 2011-06-26 DIAGNOSIS — M25519 Pain in unspecified shoulder: Secondary | ICD-10-CM

## 2011-06-26 DIAGNOSIS — J4489 Other specified chronic obstructive pulmonary disease: Secondary | ICD-10-CM

## 2011-06-26 DIAGNOSIS — J449 Chronic obstructive pulmonary disease, unspecified: Secondary | ICD-10-CM

## 2011-06-26 DIAGNOSIS — N529 Male erectile dysfunction, unspecified: Secondary | ICD-10-CM

## 2011-06-26 DIAGNOSIS — Z0000 Encounter for general adult medical examination without abnormal findings: Secondary | ICD-10-CM

## 2011-06-26 DIAGNOSIS — I10 Essential (primary) hypertension: Secondary | ICD-10-CM

## 2011-06-26 LAB — COMPREHENSIVE METABOLIC PANEL
ALT: 11 U/L (ref 0–53)
AST: 22 U/L (ref 0–37)
Albumin: 4.8 g/dL (ref 3.5–5.2)
Calcium: 9.6 mg/dL (ref 8.4–10.5)
Chloride: 107 mEq/L (ref 96–112)
Creat: 1.04 mg/dL (ref 0.50–1.35)
Potassium: 3.9 mEq/L (ref 3.5–5.3)

## 2011-06-26 LAB — CBC
HCT: 45.9 % (ref 39.0–52.0)
MCH: 31 pg (ref 26.0–34.0)
MCHC: 35.1 g/dL (ref 30.0–36.0)
RDW: 15.8 % — ABNORMAL HIGH (ref 11.5–15.5)

## 2011-06-26 MED ORDER — TRIAMTERENE-HCTZ 37.5-25 MG PO CAPS: 1.0000 | ORAL_CAPSULE | ORAL | Status: DC

## 2011-06-26 MED ORDER — DICLOFENAC SODIUM 75 MG PO TBEC: 75.0000 mg | DELAYED_RELEASE_TABLET | Freq: Two times a day (BID) | ORAL | Status: AC

## 2011-06-26 MED ORDER — SILDENAFIL CITRATE 100 MG PO TABS: 100.0000 mg | ORAL_TABLET | ORAL | Status: DC | PRN

## 2011-06-26 NOTE — Assessment & Plan Note (Signed)
Patient's blood pressure slightly elevated today this may be due to patient not taking triamterene/HCTZ. Only start this today and reevaluate blood pressure at next followup.

## 2011-06-26 NOTE — Assessment & Plan Note (Signed)
Patient complains that he has been experiencing this problem maintaining an erection during sexual activity for the past several months, denies any stress or anxiety. We'll try the patient on Viagra when necessary.

## 2011-06-26 NOTE — Assessment & Plan Note (Signed)
At baseline, no wheezing on physical exam, continue current regiment

## 2011-06-26 NOTE — Progress Notes (Signed)
  Subjective:    Patient ID: Christopher Thompson, male    DOB: [birth year], 55 y.o.   MRN: [id number]  HPI  Patient is a 55 year old male with a past medical history listed below, presents to the outpatient clinic for routine followup, patient today has no new complaints, and states that he has been compliant with medications however he has been out of his triamterene-HCTZ. Patient states that he is reducing his smoking to almost 5 cigarettes a day, he states that he is starting a new relationship and has been having difficulty maintaining an erection, denies any anxiety. Reports that he would like to try Viagra to see if that would help his symptoms. Patient has no contraindication to this. Patient denies any chest pain, shortness of breath, nausea, vomiting or any other complaints.   Patient Active Problem List  Diagnoses  . HYPERLIPIDEMIA  . TOBACCO ABUSE  . MENTAL RETARDATION, MILD  . HYPERTENSION  . COPD  . ECZEMA  . LOW BACK PAIN  . SLEEP APNEA    Current Outpatient Prescriptions on File Prior to Visit  Medication Sig Dispense Refill  . ADVAIR DISKUS 250-50 MCG/DOSE AEPB USE 2 INHALATIONS, TWO TIMES A DAY  1 each  10  . albuterol (VENTOLIN HFA) 108 (90 BASE) MCG/ACT inhaler Inhale 2 puffs into the lungs every 6 (six) hours as needed. For shortness of breath       . amLODipine (NORVASC) 10 MG tablet Take 1 tablet (10 mg total) by mouth daily.  90 tablet  0  . ipratropium (ATROVENT HFA) 17 MCG/ACT inhaler Inhale 2 puffs into the lungs every 4 (four) hours as needed. For shortness of breath       . losartan (COZAAR) 100 MG tablet TAKE 1 TABLET BY MOUTH ONCE A DAY  30 tablet  3  . metoprolol (TOPROL-XL) 100 MG 24 hr tablet TAKE 1 & 1/2 TABLET EVERY DAY  45 tablet  11  . pravastatin (PRAVACHOL) 80 MG tablet Take 1 tablet (80 mg total) by mouth daily.  30 tablet  5  . DISCONTD: hydrOXYzine (VISTARIL) 25 MG capsule Take 25 mg by mouth at bedtime.        Marland Kitchen DISCONTD: piroxicam (FELDENE) 10 MG  capsule Take 20 mg by mouth daily.        Marland Kitchen DISCONTD: triamterene-hydrochlorothiazide (DYAZIDE) 37.5-25 MG per capsule Take 1 capsule by mouth every morning.          Review of Systems  All other systems reviewed and are negative.       Objective:   Physical Exam  Constitutional: He is oriented to person, place, and time. He appears well-developed and well-nourished.  HENT:  Head: Normocephalic and atraumatic.  Eyes: Pupils are equal, round, and reactive to light.  Neck: Normal range of motion. No JVD present. No thyromegaly present.  Cardiovascular: Normal rate, regular rhythm and normal heart sounds.   Pulmonary/Chest: Effort normal and breath sounds normal. He has no wheezes. He has no rales.  Abdominal: Soft. Bowel sounds are normal. There is no tenderness. There is no rebound.  Musculoskeletal: Normal range of motion. He exhibits no edema.  Neurological: He is alert and oriented to person, place, and time.  Skin: Skin is warm and dry.          Assessment & Plan:

## 2011-06-26 NOTE — Patient Instructions (Signed)
Rotator Cuff Injury The rotator cuff is the collective set of muscles and tendons that make up the stabilizing unit of your shoulder. This unit holds in the ball of the humerus (upper arm bone) in the socket of the scapula (shoulder blade). Injuries to this stabilizing unit most commonly come from sports or activities that cause the arm to be moved repeatedly over the head. Examples of this include throwing, weight lifting, swimming, racquet sports, or an injury such as falling on your arm. Chronic (longstanding) irritation of this unit can cause inflammation (soreness), bursitis, and eventual damage to the tendons to the point of rupture (tear). An acute (sudden) injury of the rotator cuff can result in a partial or complete tear. You may need surgery with complete tears. Small or partial rotator cuff tears may be treated conservatively with temporary immobilization, exercises and rest. Physical therapy may be needed. HOME CARE INSTRUCTIONS  Apply ice to the injury for 10 minutes 3 times per day for the first 2 days. Put the ice in a plastic bag and place a towel between the bag of ice and your skin.   If you have a shoulder immobilizer (sling and straps), do not remove it until you see a caregiver for a follow-up examination. If you need to remove it, move your arm as little as possible.   You may want to sleep on several pillows or in a recliner at night to lessen swelling and pain.   Only take over-the-counter or prescription medicines for pain, discomfort, or fever as directed by your caregiver.   Do simple hand squeezing exercises with a soft rubber ball to decrease hand swelling.  SEEK MEDICAL CARE IF:  Pain in your shoulder increases or new pain or numbness develops in your arm, hand, or fingers.   Your hand or fingers are colder than your other hand.  SEEK IMMEDIATE MEDICAL CARE IF:  Your arm, hand, or fingers are numb or tingling.   Your arm, hand, or fingers are increasingly swollen  and painful, or turn white or blue.  Document Released: 12/08/2000 Document Re-Released: 03/09/2009 [facility] Patient Information 2011 [place], [place].

## 2011-06-26 NOTE — Assessment & Plan Note (Signed)
This secondary to partial rotator cuff tear in the past, suggested NSAIDs for severe pain, provided instructions on exercises to strengthen his shoulder which should help control his pain,

## 2011-06-26 NOTE — Assessment & Plan Note (Signed)
Patient was counseled on smoking cessation strategies including medications and behavior modification options. Patient said she was not ready to stop smoking at this time, but is reducing his smoking

## 2011-09-04 ENCOUNTER — Other Ambulatory Visit: Payer: Self-pay | Admitting: Internal Medicine

## 2011-09-22 ENCOUNTER — Other Ambulatory Visit: Payer: Self-pay | Admitting: *Deleted

## 2011-09-22 MED ORDER — ALBUTEROL SULFATE HFA 108 (90 BASE) MCG/ACT IN AERS: 2.0000 | INHALATION_SPRAY | Freq: Four times a day (QID) | RESPIRATORY_TRACT | Status: DC | PRN

## 2011-12-23 ENCOUNTER — Other Ambulatory Visit: Payer: Self-pay | Admitting: Internal Medicine

## 2011-12-29 NOTE — Telephone Encounter (Signed)
Called to pharm 

## 2011-12-30 ENCOUNTER — Other Ambulatory Visit: Payer: Self-pay | Admitting: Internal Medicine

## 2012-02-06 ENCOUNTER — Other Ambulatory Visit: Payer: Self-pay | Admitting: Internal Medicine

## 2012-03-07 ENCOUNTER — Other Ambulatory Visit: Payer: Self-pay | Admitting: Internal Medicine

## 2012-04-12 ENCOUNTER — Other Ambulatory Visit: Payer: Self-pay | Admitting: Internal Medicine

## 2012-04-25 ENCOUNTER — Encounter: Payer: Medicare Other | Admitting: Internal Medicine

## 2012-04-30 ENCOUNTER — Other Ambulatory Visit: Payer: Self-pay | Admitting: Internal Medicine

## 2012-05-06 ENCOUNTER — Encounter: Payer: Self-pay | Admitting: Internal Medicine

## 2012-05-06 ENCOUNTER — Ambulatory Visit (INDEPENDENT_AMBULATORY_CARE_PROVIDER_SITE_OTHER): Payer: Medicare Other | Admitting: Internal Medicine

## 2012-05-06 VITALS — BP 139/86 | HR 102 | Temp 97.9°F | Resp 20 | Ht 69.0 in | Wt 227.5 lb

## 2012-05-06 DIAGNOSIS — N529 Male erectile dysfunction, unspecified: Secondary | ICD-10-CM | POA: Diagnosis not present

## 2012-05-06 DIAGNOSIS — E785 Hyperlipidemia, unspecified: Secondary | ICD-10-CM | POA: Diagnosis not present

## 2012-05-06 DIAGNOSIS — F17200 Nicotine dependence, unspecified, uncomplicated: Secondary | ICD-10-CM | POA: Diagnosis not present

## 2012-05-06 DIAGNOSIS — I10 Essential (primary) hypertension: Secondary | ICD-10-CM | POA: Diagnosis not present

## 2012-05-06 LAB — COMPREHENSIVE METABOLIC PANEL
AST: 23 U/L (ref 0–37)
Albumin: 4.5 g/dL (ref 3.5–5.2)
BUN: 19 mg/dL (ref 6–23)
Calcium: 9.6 mg/dL (ref 8.4–10.5)
Chloride: 104 mEq/L (ref 96–112)
Glucose, Bld: 111 mg/dL — ABNORMAL HIGH (ref 70–99)
Potassium: 3.7 mEq/L (ref 3.5–5.3)

## 2012-05-06 LAB — LIPID PANEL
Cholesterol: 184 mg/dL (ref 0–200)
LDL Cholesterol: 99 mg/dL (ref 0–99)
VLDL: 47 mg/dL — ABNORMAL HIGH (ref 0–40)

## 2012-05-06 MED ORDER — SILDENAFIL CITRATE 100 MG PO TABS: 100.0000 mg | ORAL_TABLET | ORAL | Status: DC | PRN

## 2012-05-06 NOTE — Assessment & Plan Note (Signed)
Counseled on smoking cessation, patient reducing his cigarette intake,  not ready to fully quit,

## 2012-05-06 NOTE — Assessment & Plan Note (Signed)
Prescription for Viagra given.

## 2012-05-06 NOTE — Assessment & Plan Note (Signed)
Well controlled on current treatment, No new changes made today, Will continue to monitor.   

## 2012-05-06 NOTE — Patient Instructions (Signed)
--   Please take your medications as prescribed.

## 2012-05-06 NOTE — Progress Notes (Signed)
Patient ID: Christopher Thompson, male   DOB: 1956/06/12, 56 y.o.   MRN: [id number] HPI:   Patient is a 56 year old male with a past medical history listed below, presents to the outpatient clinic for routine followup, patient today has no new complaints, and states that he has been compliant with medications. Patient states that he is reducing his smoking to 3 cigarettes a day, he would like a refill on his Viagra for erectile dysfunction, denies any anxiety. Patient denies any chest pain, shortness of breath, nausea, vomiting or any other complaints.   Review of Systems: Negative except per history of present illness  Physical Exam:  Nursing notes and vitals reviewed General:  alert, well-developed, and cooperative to examination.   Lungs:  normal respiratory effort, no accessory muscle use, normal breath sounds, no crackles, and no wheezes. Heart:  normal rate, regular rhythm, no murmurs, no gallop, and no rub.   Abdomen:  soft, non-tender, normal bowel sounds, no distention, no guarding, no rebound tenderness, no hepatomegaly, and no splenomegaly.   Extremities:  No cyanosis, clubbing, edema Neurologic:  alert & oriented X3, nonfocal exam  Meds: Current Outpatient Prescriptions on File Prior to Visit  Medication Sig Dispense Refill  . ADVAIR DISKUS 250-50 MCG/DOSE AEPB TAKE 2 PUFFS TWICE A DAY  1 each  4  . amLODipine (NORVASC) 10 MG tablet TAKE 1 TABLET EVERY DAY  90 tablet  3  . diclofenac (VOLTAREN) 75 MG EC tablet Take 1 tablet (75 mg total) by mouth 2 (two) times daily with a meal.  60 tablet  0  . ipratropium (ATROVENT HFA) 17 MCG/ACT inhaler Inhale 2 puffs into the lungs every 4 (four) hours as needed. For shortness of breath       . losartan (COZAAR) 100 MG tablet TAKE 1 TABLET BY MOUTH ONCE A DAY  30 tablet  3  . metoprolol succinate (TOPROL-XL) 100 MG 24 hr tablet TAKE 1 & 1/2 TABLET EVERY DAY  45 tablet  11  . pravastatin (PRAVACHOL) 80 MG tablet TAKE 1 TABLET (80 MG TOTAL) BY MOUTH  DAILY.  30 tablet  5  . PROAIR HFA 108 (90 BASE) MCG/ACT inhaler INHALE 2 PUFFS INTO THE LUNGS EVERY 6 (SIX) HOURS AS NEEDED. FOR SHORTNESS OF BREATH  1 Inhaler  4  . triamterene-hydrochlorothiazide (DYAZIDE) 37.5-25 MG per capsule Take 1 capsule by mouth every morning.  30 capsule  5  . DISCONTD: sildenafil (VIAGRA) 100 MG tablet Take 1 tablet (100 mg total) by mouth as needed for erectile dysfunction.  4 tablet  2    Allergies: Ace inhibitors Past Medical History  Diagnosis Date  . Drug-induced hepatic toxicity 06/21/2007  . Mild mental retardation 01/07/2007  . Tobacco abuse 01/07/2007  . Low back pain 01/07/2007  . Sleep apnea 10/01/2006    mild to moderate  . Eczema 10/01/2006  . Hypertension 10/01/2006  . Hyperlipidemia 10/01/2006  . COPD (chronic obstructive pulmonary disease) 10/01/2006   No past surgical history on file. Family History  Problem Relation Age of Onset  . Hypertension Mother   . Heart disease Father    History   Social History  . Marital Status: Legally Separated    Spouse Name: N/A    Number of Children: N/A  . Years of Education: N/A   Occupational History  . Not on file.   Social History Main Topics  . Smoking status: Current Everyday Smoker -- 0.3 packs/day    Types: Cigarettes  . Smokeless tobacco: Not  on file   Comment: cutting back "on my own"  . Alcohol Use: No  . Drug Use: No  . Sexually Active: Not on file   Other Topics Concern  . Not on file   Social History Narrative  . No narrative on file

## 2012-05-06 NOTE — Assessment & Plan Note (Signed)
Well-controlled, will check fasting lipids and complete metabolic panel today.

## 2012-05-27 ENCOUNTER — Other Ambulatory Visit: Payer: Self-pay | Admitting: Internal Medicine

## 2012-06-08 ENCOUNTER — Other Ambulatory Visit: Payer: Self-pay | Admitting: Internal Medicine

## 2012-07-22 ENCOUNTER — Other Ambulatory Visit: Payer: Self-pay | Admitting: Internal Medicine

## 2012-08-21 ENCOUNTER — Other Ambulatory Visit: Payer: Self-pay | Admitting: Internal Medicine

## 2012-08-21 NOTE — Telephone Encounter (Signed)
The patient needs to have chem 7 done ASAP.

## 2012-09-27 ENCOUNTER — Other Ambulatory Visit: Payer: Self-pay | Admitting: Internal Medicine

## 2012-10-02 ENCOUNTER — Ambulatory Visit (INDEPENDENT_AMBULATORY_CARE_PROVIDER_SITE_OTHER): Payer: Medicare Other | Admitting: Internal Medicine

## 2012-10-02 ENCOUNTER — Encounter: Payer: Self-pay | Admitting: Internal Medicine

## 2012-10-02 VITALS — BP 158/97 | HR 95 | Temp 97.3°F | Wt 225.6 lb

## 2012-10-02 DIAGNOSIS — F70 Mild intellectual disabilities: Secondary | ICD-10-CM | POA: Diagnosis not present

## 2012-10-02 DIAGNOSIS — E785 Hyperlipidemia, unspecified: Secondary | ICD-10-CM | POA: Diagnosis not present

## 2012-10-02 DIAGNOSIS — F17200 Nicotine dependence, unspecified, uncomplicated: Secondary | ICD-10-CM

## 2012-10-02 DIAGNOSIS — I10 Essential (primary) hypertension: Secondary | ICD-10-CM | POA: Diagnosis not present

## 2012-10-02 DIAGNOSIS — Z23 Encounter for immunization: Secondary | ICD-10-CM | POA: Diagnosis not present

## 2012-10-02 MED ORDER — METOPROLOL SUCCINATE ER 100 MG PO TB24: 100.0000 mg | ORAL_TABLET | Freq: Every day | ORAL | Status: DC

## 2012-10-02 MED ORDER — TRIAMTERENE-HCTZ 37.5-25 MG PO CAPS: 1.0000 | ORAL_CAPSULE | ORAL | Status: DC

## 2012-10-02 MED ORDER — AMLODIPINE BESYLATE 10 MG PO TABS: 10.0000 mg | ORAL_TABLET | Freq: Every day | ORAL | Status: DC

## 2012-10-02 MED ORDER — PRAVASTATIN SODIUM 80 MG PO TABS: 80.0000 mg | ORAL_TABLET | Freq: Every day | ORAL | Status: DC

## 2012-10-02 MED ORDER — LOSARTAN POTASSIUM 100 MG PO TABS: 100.0000 mg | ORAL_TABLET | Freq: Every day | ORAL | Status: DC

## 2012-10-02 NOTE — Assessment & Plan Note (Signed)
Unable to find documentation regarding this problem.

## 2012-10-02 NOTE — Patient Instructions (Addendum)
-  Start taking Pravastatin at night before bed, you do not need to take this medication with food.  -Congratulations on how much you've cut back on smoking - try to cut it completely out now!  -Be sure to restart all of your blood pressure medications.  If you cannot afford your Dyazide, please call the clinic because I can send in another pill that you will take half a tablet daily.  Please be sure to bring all of your medications with you to every visit.  Should you have any new or worsening symptoms, please be sure to call the clinic at [phone].

## 2012-10-02 NOTE — Assessment & Plan Note (Signed)
Lab Results  Component Value Date   NA 138 05/06/2012   K 3.7 05/06/2012   CL 104 05/06/2012   CO2 24 05/06/2012   BUN 19 05/06/2012   CREATININE 1.30 05/06/2012   CREATININE 1.12 03/29/2010    BP Readings from Last 3 Encounters:  10/02/12 158/97  05/06/12 139/86  06/26/11 151/101    Assessment: Hypertension control:  moderately elevated  Progress toward goals:  deteriorated Barriers to meeting goals:  nonadherence to medications  Plan: Hypertension treatment:  continue current medications - refilled all antihypertensive meds; if cannot afford dyazide, will send in maxide 75-50, take half a tablet daily, as it seems this is on his formulary BMET today

## 2012-10-02 NOTE — Assessment & Plan Note (Signed)
LDL = 99 in 04/2012; continue pravastatin daily - instructed to start taking medication at night

## 2012-10-02 NOTE — Assessment & Plan Note (Signed)
Discussed smoking cessation - patient seems motivated

## 2012-10-02 NOTE — Progress Notes (Signed)
Subjective:   Patient ID: Christopher Thompson male   DOB: 1956-03-07 56 y.o.   MRN: [id number]  HPI: [name] is a 56 y.o. male with PMH significant for HTN, HLD & COPD who presents for routine follow and needs medication refills.  He has been out of medications for the last few months.  He thought he was coming to pick up his medications today, and didn't realize he had a MD appt.    He continues to smoke cigarettes, but notes that he is trying to cut back on his own.  Meds for BP include amlodipine 10, losartan 10, metoprolol 100 daily, triamterene-HCTZ 37.5-25.  Most recently had labs on 05/06/12.   Past Medical History  Diagnosis Date  . Drug-induced hepatic toxicity 06/21/2007  . Mild mental retardation 01/07/2007  . Tobacco abuse 01/07/2007  . Low back pain 01/07/2007  . Sleep apnea 10/01/2006    mild to moderate  . Eczema 10/01/2006  . Hypertension 10/01/2006  . Hyperlipidemia 10/01/2006  . COPD (chronic obstructive pulmonary disease) 10/01/2006   Current Outpatient Prescriptions  Medication Sig Dispense Refill  . ADVAIR DISKUS 250-50 MCG/DOSE AEPB TAKE 2 PUFFS TWICE A DAY  1 each  4  . amLODipine (NORVASC) 10 MG tablet TAKE 1 TABLET EVERY DAY  90 tablet  3  . ipratropium (ATROVENT HFA) 17 MCG/ACT inhaler Inhale 2 puffs into the lungs every 4 (four) hours as needed. For shortness of breath       . losartan (COZAAR) 100 MG tablet TAKE 1 TABLET BY MOUTH ONCE A DAY  30 tablet  1  . losartan (COZAAR) 100 MG tablet TAKE 1 TABLET BY MOUTH ONCE A DAY  30 tablet  1  . metoprolol succinate (TOPROL-XL) 100 MG 24 hr tablet TAKE 1 & 1/2 TABLET EVERY DAY  45 tablet  11  . pravastatin (PRAVACHOL) 80 MG tablet TAKE 1 TABLET (80 MG TOTAL) BY MOUTH DAILY.  30 tablet  5  . PROAIR HFA 108 (90 BASE) MCG/ACT inhaler INHALE 2 PUFFS INTO THE LUNGS EVERY 6 (SIX) HOURS AS NEEDED. FOR SHORTNESS OF BREATH  1 Inhaler  4  . PROAIR HFA 108 (90 BASE) MCG/ACT inhaler INHALE 2 PUFFS INTO THE LUNGS EVERY 6  (SIX) HOURS AS NEEDED. FOR SHORTNESS OF BREATH  1 Inhaler  1  . sildenafil (VIAGRA) 100 MG tablet Take 1 tablet (100 mg total) by mouth as needed for erectile dysfunction.  4 tablet  2  . triamterene-hydrochlorothiazide (DYAZIDE) 37.5-25 MG per capsule Take 1 capsule by mouth every morning.  30 capsule  5   Family History  Problem Relation Age of Onset  . Hypertension Mother   . Heart disease Father    History   Social History  . Marital Status: Legally Separated    Spouse Name: N/A    Number of Children: N/A  . Years of Education: N/A   Social History Main Topics  . Smoking status: Current Every Day Smoker -- 0.3 packs/day    Types: Cigarettes  . Smokeless tobacco: Not on file   Comment: cutting back "on my own"  . Alcohol Use: No  . Drug Use: No  . Sexually Active: Not on file   Other Topics Concern  . Not on file   Social History Narrative  . No narrative on file   Review of Systems: General: no fevers, chills, changes in weight, changes in appetite Skin: no rash HEENT: no blurry vision, hearing changes, sore throat Pulm: no  dyspnea, coughing, wheezing CV: no chest pain, palpitations, shortness of breath Abd: no abdominal pain, nausea/vomiting, diarrhea/constipation GU: no dysuria, hematuria, polyuria Ext: chronic arthralgias Neuro: no weakness, numbness, or tingling   Objective:  Physical Exam: Filed Vitals:   10/02/12 0905  BP: 158/97  Pulse: 95  Temp: 97.3 F (36.3 C)  TempSrc: Oral  Weight: 225 lb 9.6 oz (102.331 kg)   Constitutional: Vital signs reviewed.  Patient is a well-developed and well-nourished man in no acute distress and cooperative with exam.  Head: Normocephalic and atraumatic Mouth: no erythema or exudates, MMM Eyes: PERRL, EOMI, conjunctivae normal, No scleral icterus.  Cardiovascular: RRR, S1 normal, S2 normal, no MRG, pulses symmetric and intact bilaterally Pulmonary/Chest: CTAB, no wheezes, rales, or rhonchi Abdominal: Soft.  Non-tender, non-distended, bowel sounds are normal, no masses, organomegaly, or guarding present.  Neurological: A&O x3, Strength is normal and symmetric bilaterally, cranial nerve II-XII are grossly intact, no focal motor deficit, sensory intact to light touch bilaterally.  Skin: Warm, dry and intact. No rash, cyanosis, or clubbing.  Psychiatric: Normal mood and affect. speech and behavior is normal. Judgment and thought content normal. Cognition and memory are normal.   Assessment & Plan:   Case and care discussed with Dr. Kem Kays.  Please see problem oriented charting for further details. Patient to return 3 months for blood pressure follow up with PCP.

## 2012-10-03 LAB — BASIC METABOLIC PANEL
Chloride: 105 mEq/L (ref 96–112)
Potassium: 4.7 mEq/L (ref 3.5–5.3)
Sodium: 139 mEq/L (ref 135–145)

## 2012-10-17 ENCOUNTER — Other Ambulatory Visit: Payer: Self-pay | Admitting: Internal Medicine

## 2012-10-18 MED ORDER — ALBUTEROL SULFATE HFA 108 (90 BASE) MCG/ACT IN AERS: 2.0000 | INHALATION_SPRAY | Freq: Four times a day (QID) | RESPIRATORY_TRACT | Status: DC | PRN

## 2012-11-08 ENCOUNTER — Other Ambulatory Visit: Payer: Self-pay | Admitting: Internal Medicine

## 2012-11-08 NOTE — Telephone Encounter (Signed)
A 4 month supply was given on 10/02/12. Has enough refills left.

## 2012-11-18 ENCOUNTER — Other Ambulatory Visit: Payer: Self-pay | Admitting: Internal Medicine

## 2012-11-18 NOTE — Telephone Encounter (Signed)
Christopher Thompson, I was unable to find the ote that the pt was dismissed. Can you tell me where it is located? HE is assigned PCP. Thjanks

## 2012-11-19 NOTE — Telephone Encounter (Signed)
I discussed with Christopher Thompson and see no indication that patient has been dismissed from [facility].  It is unclear where the "Patient Highlight" originated.  I approved the refill.  Please schedule a follow-up appointment with patient's PCP within 1-2 months if available.

## 2012-12-26 ENCOUNTER — Other Ambulatory Visit: Payer: Self-pay | Admitting: Licensed Clinical Social Worker

## 2012-12-26 DIAGNOSIS — I10 Essential (primary) hypertension: Secondary | ICD-10-CM

## 2012-12-26 DIAGNOSIS — E785 Hyperlipidemia, unspecified: Secondary | ICD-10-CM

## 2013-01-14 ENCOUNTER — Encounter: Payer: Self-pay | Admitting: Internal Medicine

## 2013-01-16 ENCOUNTER — Telehealth: Payer: Self-pay | Admitting: Internal Medicine

## 2013-01-16 NOTE — Telephone Encounter (Signed)
Attempted to call patient this pm to get him schedule for an appt in March.  When you call the home phone number it just goes straight to voicemail.  I left a message and patient was mailed a letter.

## 2013-02-05 ENCOUNTER — Other Ambulatory Visit: Payer: Self-pay | Admitting: Internal Medicine

## 2013-02-05 ENCOUNTER — Encounter: Payer: Medicare Other | Admitting: Internal Medicine

## 2013-02-05 DIAGNOSIS — J449 Chronic obstructive pulmonary disease, unspecified: Secondary | ICD-10-CM

## 2013-02-05 MED ORDER — FLUTICASONE-SALMETEROL 250-50 MCG/DOSE IN AEPB: INHALATION_SPRAY | RESPIRATORY_TRACT | Status: DC

## 2013-02-05 MED ORDER — ALBUTEROL SULFATE HFA 108 (90 BASE) MCG/ACT IN AERS: 2.0000 | INHALATION_SPRAY | Freq: Four times a day (QID) | RESPIRATORY_TRACT | Status: DC | PRN

## 2013-02-06 ENCOUNTER — Ambulatory Visit: Payer: Medicare Other | Admitting: Internal Medicine

## 2013-03-12 ENCOUNTER — Encounter: Payer: Self-pay | Admitting: Internal Medicine

## 2013-03-12 ENCOUNTER — Ambulatory Visit (INDEPENDENT_AMBULATORY_CARE_PROVIDER_SITE_OTHER): Payer: Medicare Other | Admitting: Internal Medicine

## 2013-03-12 VITALS — BP 125/89 | HR 89 | Temp 97.8°F | Wt 230.8 lb

## 2013-03-12 DIAGNOSIS — E785 Hyperlipidemia, unspecified: Secondary | ICD-10-CM | POA: Diagnosis not present

## 2013-03-12 DIAGNOSIS — F17200 Nicotine dependence, unspecified, uncomplicated: Secondary | ICD-10-CM

## 2013-03-12 DIAGNOSIS — J449 Chronic obstructive pulmonary disease, unspecified: Secondary | ICD-10-CM

## 2013-03-12 DIAGNOSIS — I10 Essential (primary) hypertension: Secondary | ICD-10-CM | POA: Diagnosis not present

## 2013-03-12 DIAGNOSIS — J4489 Other specified chronic obstructive pulmonary disease: Secondary | ICD-10-CM

## 2013-03-12 MED ORDER — IPRATROPIUM BROMIDE HFA 17 MCG/ACT IN AERS: 2.0000 | INHALATION_SPRAY | RESPIRATORY_TRACT | Status: DC | PRN

## 2013-03-12 NOTE — Patient Instructions (Signed)
General Instructions: -Dineen Kid doing great! Keep up the good work with taking all of your medications.  If you feel short of breath, try the atrovent inhaler before your albuterol inhaler.  Continue to take your Advair inhaler daily.   -If you have increased cough, shortness of breath and/or increased sputum production for more than 2 days, please call us for an appointment.  -At your next visit, we will check your blood work to monitor your cholesterol and kidney function  -You can quit smoking- it's all about making the decision to do it!  Try the nicotine patches or the gum to help you quit  -At your next, we will talk about getting a colonoscopy and your tetanus shot.  Please be sure to bring all of your medications with you to every visit.  Should you have any new or worsening symptoms, please be sure to call the clinic at [phone].   Progress Toward Treatment Goals:  Treatment Goal 03/12/2013  Blood pressure improved  Stop smoking smoking less    Self Care Goals & Plans:  Self Care Goal 03/12/2013  Manage my medications take my medicines as prescribed; refill my medications on time  Monitor my health keep track of my blood pressure  Eat healthy foods eat foods that are low in salt; eat baked foods instead of fried foods  Be physically active find an activity I enjoy  Stop smoking go to the [employer] ([email or web address])       Care Management & Community Referrals:  Referral 03/12/2013  Referrals made for care management support none needed

## 2013-03-12 NOTE — Assessment & Plan Note (Signed)
Spent 10 min discussing tobacco cessation. Motivated to quit. Will consider patches or gum - needs to set a quit date.

## 2013-03-12 NOTE — Assessment & Plan Note (Addendum)
BP Readings from Last 3 Encounters:  03/12/13 125/89  10/02/12 158/97  05/06/12 139/86    Lab Results  Component Value Date   NA 139 10/02/2012   K 4.7 10/02/2012   CREATININE 0.96 10/02/2012    Assessment:  Blood pressure control: mildly elevated - improved on recheck  Progress toward BP goal:  improved  Plan:  Medications:  continue current medications - amlodipine 10, losartan 100, metoprolol 100 ER, dyazide 37.5-25  Educational resources provided: brochure;video  Self management tools provided: home blood pressure logbook CMET at next follow up

## 2013-03-12 NOTE — Assessment & Plan Note (Signed)
Stable, will refill atrovent and encouraged use of atrovent over albuterol.  Continue advair daily. Informed to call for appt if he experiences increased dyspnea, sputum production, or cough for more than 2 days.

## 2013-03-12 NOTE — Assessment & Plan Note (Signed)
Taking pravastatin qHS. Will check lipid profile at next follow up.

## 2013-03-12 NOTE — Progress Notes (Signed)
Subjective:   Patient ID: Christopher Thompson male   DOB: [birth year] 57 y.o.   MRN: [id number]  HPI: Christopher Thompson is a 57 y.o. male with PMH significant for HTN, HLD & COPD who presents for routine follow.  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  No current issues, but just informing me;  pain resolves for 2-3 months at a time.  Also informing me of learning disability - didn't finish school (7th grade - 2 or 3 years), "slow"; went to court for disability years ago and deemed to have learning disability --> cannot be around a crowd of ppl, doesn't needs meds (2008/2009)  Breathing well, uses albuterol every 2-3 days (more frequent due to winter); uses advair daily; does not have atrovent inhaler  Reports intentional weight loss (though 5lb weight gain per our records)  3-4 cig/day - less than before, was doing 1 PPD, has been smoking since 57 yo, motivated to quit; has tried pills 5 or 6 years ago, but they made him smoke more. Willing to consider trying nicotine patch or gum, but needs to think about it some more.  Declines colonoscopy & tdap - not prepared for it and asks to think about   Past Medical History  Diagnosis Date  . Drug-induced hepatic toxicity 06/21/2007  . Learning disability 01/07/2007    Determined in court for disability  . Tobacco abuse 01/07/2007  . Sleep apnea 10/01/2006    mild to moderate  . Eczema 10/01/2006  . Hypertension 10/01/2006  . Hyperlipidemia 10/01/2006  . COPD (chronic obstructive pulmonary disease) 10/01/2006   Current Outpatient Prescriptions  Medication Sig Dispense Refill  . albuterol (PROAIR HFA) 108 (90 BASE) MCG/ACT inhaler Inhale 2 puffs into the lungs every 6 (six) hours as needed for wheezing.  8.5 g  6  . amLODipine (NORVASC) 10 MG tablet Take 1 tablet (10 mg total) by mouth daily.  90 tablet  3  . Fluticasone-Salmeterol (ADVAIR DISKUS) 250-50 MCG/DOSE AEPB TAKE 2 PUFFS TWICE A  DAY  120 each  5  . losartan (COZAAR) 100 MG tablet Take 1 tablet (100 mg total) by mouth daily.  30 tablet  3  . metoprolol succinate (TOPROL-XL) 100 MG 24 hr tablet Take 1 tablet (100 mg total) by mouth daily. Take with or immediately following a meal.  45 tablet  11  . pravastatin (PRAVACHOL) 80 MG tablet Take 1 tablet (80 mg total) by mouth at bedtime.  30 tablet  5  . triamterene-hydrochlorothiazide (DYAZIDE) 37.5-25 MG per capsule Take 1 each (1 capsule total) by mouth every morning.  30 capsule  5  . ipratropium (ATROVENT HFA) 17 MCG/ACT inhaler Inhale 2 puffs into the lungs every 4 (four) hours as needed. For shortness of breath  1 Inhaler  3   No current facility-administered medications for this visit.   Family History  Problem Relation Age of Onset  . Hypertension Mother   . Heart disease Father    History   Social History  . Marital Status: Legally Separated    Spouse Name: N/A    Number of Children: N/A  . Years of Education: N/A   Social History Main Topics  . Smoking status: Current Every Day Smoker -- 0.30 packs/day    Types: Cigarettes  . Smokeless tobacco: None     Comment: cutting back "on my own"  . Alcohol Use: No  . Drug Use: No  . Sexually  Active: None   Other Topics Concern  . None   Social History Narrative  . None   Review of Systems: Constitutional: Denies fever, chills, diaphoresis, appetite change and fatigue.  HEENT: Denies photophobia, eye pain, redness, hearing loss, ear pain, congestion, sore throat, rhinorrhea, sneezing, mouth sores, trouble swallowing, neck pain, neck stiffness and tinnitus.   Respiratory: Denies SOB, DOE, cough, chest tightness,  and wheezing.   Cardiovascular: Denies chest pain, palpitations and leg swelling.  Gastrointestinal: Denies nausea, vomiting, abdominal pain, diarrhea, constipation, blood in stool and abdominal distention.  Genitourinary: Denies dysuria, urgency, frequency, hematuria, flank pain and difficulty  urinating.  Musculoskeletal: Denies myalgias, back pain, joint swelling, and gait problem.  Skin: Denies pallor, rash and wound.  Neurological: Denies dizziness, seizures, syncope, weakness, light-headedness, numbness and headaches.  Psychiatric/Behavioral: Denies suicidal ideation, mood changes, confusion, nervousness, sleep disturbance and agitation  Objective:  Physical Exam: Filed Vitals:   03/12/13 1357 03/12/13 1437  BP: 141/88 125/89  Pulse: 91 89  Temp: 97.8 F (36.6 C)   TempSrc: Oral   Weight: 230 lb 12.8 oz (104.69 kg)   SpO2: 97%    Constitutional: Vital signs reviewed.  Patient is a well-developed and well-nourished man in no acute distress and cooperative with exam. Head: Normocephalic and atraumatic Mouth: no erythema or exudates, MMM Eyes: PERRL, EOMI, conjunctivae normal, No scleral icterus.  Cardiovascular: RRR, S1 normal, S2 normal, no MRG, pulses symmetric and intact bilaterally Pulmonary/Chest: CTAB, no wheezes, rales, or rhonchi Abdominal: Soft. Non-tender, non-distended, bowel sounds are normal, no masses, organomegaly, or guarding present.  Musculoskeletal: No joint deformities, erythema, or stiffness,  Neurological: A&O x3, Strength is normal and symmetric bilaterally, cranial nerve II-XII are grossly intact, no focal motor deficit, sensory intact to light touch bilaterally.  Skin: Warm, dry and intact. No rash, cyanosis, or clubbing.  Psychiatric: Normal mood and affect. speech and behavior is normal. Judgment and thought content normal. Cognition and memory are normal.   Assessment & Plan:  Case and care discussed with Dr. Dalphine Handing. Please see problem oriented charting for further details. Patient to return in 6 months for routine follow up and labs (CMET, lipids)

## 2013-04-07 ENCOUNTER — Other Ambulatory Visit: Payer: Self-pay | Admitting: Internal Medicine

## 2013-04-24 ENCOUNTER — Other Ambulatory Visit: Payer: Self-pay | Admitting: Internal Medicine

## 2013-05-31 ENCOUNTER — Other Ambulatory Visit: Payer: Self-pay | Admitting: Internal Medicine

## 2013-06-04 NOTE — Telephone Encounter (Signed)
Dr Everardo Beals is on night float.

## 2013-08-17 ENCOUNTER — Other Ambulatory Visit: Payer: Self-pay | Admitting: Internal Medicine

## 2013-08-24 ENCOUNTER — Other Ambulatory Visit: Payer: Self-pay | Admitting: Internal Medicine

## 2013-09-23 ENCOUNTER — Other Ambulatory Visit: Payer: Self-pay | Admitting: *Deleted

## 2013-09-24 MED ORDER — PRAVASTATIN SODIUM 80 MG PO TABS: 80.0000 mg | ORAL_TABLET | Freq: Every day | ORAL | Status: DC

## 2013-09-26 ENCOUNTER — Other Ambulatory Visit: Payer: Self-pay | Admitting: Internal Medicine

## 2013-11-12 ENCOUNTER — Ambulatory Visit (INDEPENDENT_AMBULATORY_CARE_PROVIDER_SITE_OTHER): Payer: Medicare Other | Admitting: Internal Medicine

## 2013-11-12 ENCOUNTER — Encounter: Payer: Self-pay | Admitting: Internal Medicine

## 2013-11-12 VITALS — BP 143/91 | HR 98 | Temp 98.2°F | Wt 231.0 lb

## 2013-11-12 DIAGNOSIS — J449 Chronic obstructive pulmonary disease, unspecified: Secondary | ICD-10-CM

## 2013-11-12 DIAGNOSIS — E785 Hyperlipidemia, unspecified: Secondary | ICD-10-CM

## 2013-11-12 DIAGNOSIS — I10 Essential (primary) hypertension: Secondary | ICD-10-CM | POA: Diagnosis not present

## 2013-11-12 DIAGNOSIS — Z23 Encounter for immunization: Secondary | ICD-10-CM | POA: Diagnosis not present

## 2013-11-12 DIAGNOSIS — F17200 Nicotine dependence, unspecified, uncomplicated: Secondary | ICD-10-CM | POA: Diagnosis not present

## 2013-11-12 MED ORDER — TRIAMTERENE-HCTZ 37.5-25 MG PO CAPS: 1.0000 | ORAL_CAPSULE | ORAL | Status: DC

## 2013-11-12 NOTE — Progress Notes (Signed)
Subjective:   Patient ID: Christopher Thompson male   DOB: [birth year] 57 y.o.   MRN: [id number]  HPI: Christopher Thompson is a 57 y.o. male with PMH significant for HTN, HLD & COPD who presents for routine follow.  He has no complaints today.  He reports that since his last visit with me, he has tried to quit smoking 3x, and has succeeded for 3-5 days, but then withdrawal sx always lead him back to smoking. He declines use of a nicotine patch or oral cessation aid.  He asks about use of vapor cigarettes.  Other than this he feels well and has been taking his medications, except for  His triamterene HCTZ, which he forgot about.  He reports daily use of amlodipine, metoprolol, losartan and pravastatin as well as his advair and albuterol/atrovent prn, which has been infrequently.   Declines colonoscopy & tdap - asks to discuss it after the new year. He does agree to flu shot today.    Review of Systems: Constitutional: Denies fever, chills, diaphoresis, appetite change and fatigue.  HEENT: Denies photophobia, eye pain, redness, hearing loss, ear pain, congestion, sore throat, rhinorrhea, sneezing, mouth sores, trouble swallowing, neck pain, neck stiffness and tinnitus.  Respiratory: Denies SOB, DOE, cough, chest tightness, and wheezing.  Cardiovascular: Denies chest pain, palpitations and leg swelling.  Gastrointestinal: Denies nausea, vomiting, abdominal pain, diarrhea, constipation,blood in stool and abdominal distention.  Genitourinary: Denies dysuria, urgency, frequency, hematuria, flank pain and difficulty urinating.  Musculoskeletal: Denies myalgias, back pain, joint swelling, arthralgias and gait problem.  Skin: Denies pallor, rash and wound.  Neurological: Denies dizziness, seizures, syncope, weakness, lightheadedness, numbness and headaches.   Past Medical History  Diagnosis Date  . Drug-induced hepatic toxicity 06/21/2007  . Learning disability 01/07/2007    Determined in court for disability   . Tobacco abuse 01/07/2007  . Sleep apnea 10/01/2006    mild to moderate  . Eczema 10/01/2006  . Hypertension 10/01/2006  . Hyperlipidemia 10/01/2006  . COPD (chronic obstructive pulmonary disease) 10/01/2006   Current Outpatient Prescriptions  Medication Sig Dispense Refill  . amLODipine (NORVASC) 10 MG tablet TAKE 1 TABLET EVERY DAY  90 tablet  3  . ATROVENT HFA 17 MCG/ACT inhaler INHALE 2 PUFFS INTO THE LUNGS EVERY 4 HOURS AS NEEDED FOR SHORTNESS OF BREATH  12.9 g  3  . Fluticasone-Salmeterol (ADVAIR DISKUS) 250-50 MCG/DOSE AEPB TAKE 2 PUFFS TWICE A DAY  120 each  5  . losartan (COZAAR) 100 MG tablet TAKE 1 TABLET BY MOUTH EVERY DAY  30 tablet  3  . metoprolol succinate (TOPROL-XL) 100 MG 24 hr tablet TAKE 1 & 1/2 TABLET EVERY DAY  45 tablet  11  . pravastatin (PRAVACHOL) 80 MG tablet Take 1 tablet (80 mg total) by mouth daily.  90 tablet  1  . PROAIR HFA 108 (90 BASE) MCG/ACT inhaler INHALE 2 PUFFS INTO THE LUNGS EVERY 6 (SIX) HOURS AS NEEDED FOR WHEEZING.  8.5 each  6  . triamterene-hydrochlorothiazide (DYAZIDE) 37.5-25 MG per capsule Take 1 each (1 capsule total) by mouth every morning.  30 capsule  5   No current facility-administered medications for this visit.   Family History  Problem Relation Age of Onset  . Hypertension Mother   . Heart disease Father    History   Social History  . Marital Status: Legally Separated    Spouse Name: N/A    Number of Children: N/A  . Years of Education: 7th grade  Occupational History  . disability     due to learning disability per patient; can only do physical work, and hurt his left shoulder on the job years ago   Social History Main Topics  . Smoking status: Current Every Day Smoker -- 0.30 packs/day    Types: Cigarettes  . Smokeless tobacco: None     Comment: cutting back "on my own"  . Alcohol Use: No  . Drug Use: No  . Sexual Activity: None   Other Topics Concern  . None   Social History Narrative  . None     Objective:  Physical Exam: Filed Vitals:   11/12/13 1611  BP: 143/91  Pulse: 98  Temp: 98.2 F (36.8 C)  TempSrc: Oral  Weight: 231 lb (104.781 kg)  SpO2: 98%   General: appears as stated age HEENT: PERRL, EOMI, no scleral icterus Cardiac: RRR, no rubs, murmurs or gallops Pulm: clear to auscultation bilaterally, moving normal volumes of air Abd: soft, nontender, nondistended, BS normoactive Ext: warm and well perfused, no pedal edema Neuro: alert and oriented X3, cranial nerves II-XII grossly intact  Assessment & Plan:  Case and care discussed with Dr. Rogelia Boga.  Please see problem oriented charting for further details. Patient to return in 3 months for HTN follow up and to discuss TDap and Colonoscopy.

## 2013-11-12 NOTE — Patient Instructions (Addendum)
General Instructions:  -Dineen Kid doing great! Keep up the good work with taking all of your medications.  To review, your blood pressure medications are metoprolol, losartan and triamterene-hydrochlorothiazide, which you should be taking daily.  If you feel short of breath, try the atrovent inhaler before your albuterol inhaler. Continue to take your Advair inhaler daily.   -If you have increased cough, shortness of breath and/or increased sputum production for more than 2 days, please call us for an appointment.  -You can quit smoking- it's all about making the decision to do it! Try the nicotine patches or the gum to help you quit  -At your next, we will talk about getting a colonoscopy and your tetanus shot. You got your flu shot today  Please be sure to bring all of your medications with you to every visit.  Should you have any new or worsening symptoms, please be sure to call the clinic at [phone].   Treatment Goals:  Goals (1 Years of Data) as of 11/12/13         05/06/12     Result Component    . LDL CALC < 130  99      Progress Toward Treatment Goals:  Treatment Goal 11/12/2013  Blood pressure unchanged  Stop smoking smoking less    Self Care Goals & Plans:  Self Care Goal 11/12/2013  Manage my medications take my medicines as prescribed; bring my medications to every visit; refill my medications on time  Monitor my health -  Eat healthy foods drink diet soda or water instead of juice or soda; eat foods that are low in salt; eat baked foods instead of fried foods  Be physically active find an activity I enjoy; take a walk every day  Stop smoking go to the [employer] ([employer]); call [facility] (1-800-QUIT-NOW)    No flowsheet data found.   Care Management & Community Referrals:  Referral 03/12/2013  Referrals made for care management support none needed

## 2013-11-13 LAB — LIPID PANEL
Cholesterol: 179 mg/dL (ref 0–200)
HDL: 38 mg/dL — ABNORMAL LOW (ref >39)
Total CHOL/HDL Ratio: 4.7 Ratio
VLDL: 25 mg/dL (ref 0–40)

## 2013-11-13 LAB — COMPREHENSIVE METABOLIC PANEL
AST: 24 U/L (ref 0–37)
Alkaline Phosphatase: 116 U/L (ref 39–117)
BUN: 17 mg/dL (ref 6–23)
Creat: 1.15 mg/dL (ref 0.50–1.35)
Glucose, Bld: 97 mg/dL (ref 70–99)
Total Bilirubin: 0.5 mg/dL (ref 0.3–1.2)

## 2013-11-14 NOTE — Assessment & Plan Note (Signed)
Lipid profile today, LDL 116, continue statin therapy.

## 2013-11-14 NOTE — Assessment & Plan Note (Signed)
  Assessment: Progress toward smoking cessation:  smoking less Barriers to progress toward smoking cessation:  withdrawal symptoms Comments: not interested in patch/oral medication, will try vapor cigarettes  Plan: Instruction/counseling given:  I counseled patient on the dangers of tobacco use, advised patient to stop smoking, and reviewed strategies to maximize success. Educational resources provided:  [employer]) brochure Self management tools provided:  smoking cessation plan (STAR Quit Plan) Medications to assist with smoking cessation:  vapor cigarettes Patient agreed to the following self-care plans for smoking cessation: go to the [employer] (www.quitlinenc.com);call [facility] (1-800-QUIT-NOW)

## 2013-11-14 NOTE — Assessment & Plan Note (Signed)
BP Readings from Last 3 Encounters:  11/12/13 143/91  03/12/13 125/89  10/02/12 158/97    Lab Results  Component Value Date   NA 138 11/12/2013   K 3.8 11/12/2013   CREATININE 1.15 11/12/2013    Assessment: Blood pressure control: mildly elevated Progress toward BP goal:  unchanged  Plan: Medications:  continue current medications - refilled triamterene HCTZ, cont amlodipine, losartan and metoprolol  Educational resources provided: brochure Self management tools provided: home blood pressure logbook

## 2013-11-14 NOTE — Assessment & Plan Note (Signed)
Stable, but I don't see PFTs in the record. Will discuss with patient at next visit.

## 2013-11-19 NOTE — Progress Notes (Signed)
Case discussed with Dr. Sharda soon after the resident saw the patient.  We reviewed the resident's history and exam and pertinent patient test results.  I agree with the assessment, diagnosis, and plan of care documented in the resident's note. 

## 2014-02-24 ENCOUNTER — Other Ambulatory Visit: Payer: Self-pay | Admitting: Internal Medicine

## 2014-03-14 ENCOUNTER — Other Ambulatory Visit: Payer: Self-pay | Admitting: Internal Medicine

## 2014-03-18 ENCOUNTER — Encounter: Payer: Self-pay | Admitting: Internal Medicine

## 2014-03-18 ENCOUNTER — Ambulatory Visit (INDEPENDENT_AMBULATORY_CARE_PROVIDER_SITE_OTHER): Payer: Medicare Other | Admitting: Internal Medicine

## 2014-03-18 VITALS — BP 130/86 | HR 94 | Temp 97.6°F | Wt 235.3 lb

## 2014-03-18 DIAGNOSIS — E785 Hyperlipidemia, unspecified: Secondary | ICD-10-CM

## 2014-03-18 DIAGNOSIS — J449 Chronic obstructive pulmonary disease, unspecified: Secondary | ICD-10-CM

## 2014-03-18 DIAGNOSIS — F17200 Nicotine dependence, unspecified, uncomplicated: Secondary | ICD-10-CM | POA: Diagnosis not present

## 2014-03-18 DIAGNOSIS — Z0000 Encounter for general adult medical examination without abnormal findings: Secondary | ICD-10-CM

## 2014-03-18 DIAGNOSIS — Z23 Encounter for immunization: Secondary | ICD-10-CM

## 2014-03-18 DIAGNOSIS — J4489 Other specified chronic obstructive pulmonary disease: Secondary | ICD-10-CM

## 2014-03-18 DIAGNOSIS — I10 Essential (primary) hypertension: Secondary | ICD-10-CM | POA: Diagnosis not present

## 2014-03-18 NOTE — Assessment & Plan Note (Signed)
Compliant with statin, cont therapy. LDL at goal.

## 2014-03-18 NOTE — Assessment & Plan Note (Signed)
Uses all inhalers sparingly.  Will request PFTs, if true COPD, will encourage daily use of Advair to delay progression/decline of FEV1

## 2014-03-18 NOTE — Assessment & Plan Note (Signed)
BP Readings from Last 3 Encounters:  03/18/14 130/86  11/12/13 143/91  03/12/13 125/89    Lab Results  Component Value Date   NA 138 11/12/2013   K 3.8 11/12/2013   CREATININE 1.15 11/12/2013    Assessment: Blood pressure control: controlled Progress toward BP goal:  at goal  Plan: Medications:  continue current medications - dyazide, amlodipine, losartan, toprol xl Educational resources provided: brochure Self management tools provided: home blood pressure logbook Other plans: since no changes in meds, will not order BMET today, check renal fxn at next visit

## 2014-03-18 NOTE — Patient Instructions (Signed)
General Instructions:  Your blood pressure looks great! Keep it up!!  No changes to your medications at this time  Today you got your tetanus shot, next time we will discuss your colonoscopy  Please be sure to bring all of your medications with you to every visit.  Should you have any new or worsening symptoms, please be sure to call the clinic at [phone].   Treatment Goals:  Goals (1 Years of Data) as of 03/18/14         11/12/13 05/06/12     Result Component    . LDL CALC < 130  116 99      Progress Toward Treatment Goals:  Treatment Goal 03/18/2014  Blood pressure at goal  Stop smoking smoking less    Self Care Goals & Plans:  Self Care Goal 03/18/2014  Manage my medications take my medicines as prescribed; refill my medications on time; bring my medications to every visit  Monitor my health keep track of my blood pressure  Eat healthy foods eat foods that are low in salt; eat baked foods instead of fried foods; drink diet soda or water instead of juice or soda  Be physically active -  Stop smoking go to the [employer] website ([email or web address]eewww[employer]quitlinenc.com)  Meeting treatment goals maintain the current self-care plan    No flowsheet data found.   Care Management & Community Referrals:  Referral 03/18/2014  Referrals made for care management support none needed

## 2014-03-18 NOTE — Assessment & Plan Note (Signed)
Tdap today. Declines colonoscopy.

## 2014-03-18 NOTE — Assessment & Plan Note (Signed)
  Assessment: Progress toward smoking cessation:  smoking less Barriers to progress toward smoking cessation:  withdrawal symptoms  Plan: Instruction/counseling given:  I counseled patient on the dangers of tobacco use, advised patient to stop smoking, and reviewed strategies to maximize success. Educational resources provided:  [facility] [employer](1-800-QUIT-NOW) brochure Self management tools provided:  smoking cessation plan (STAR Quit Plan) Medications to assist with smoking cessation:  None Patient agreed to the following self-care plans for smoking cessation: go to the [employer] website ([email or web address]eewww[employer]quit[facility].[email or web address])

## 2014-03-18 NOTE — Progress Notes (Signed)
Subjective:   Patient ID: Christopher Thompson male   DOB: December 20, 1956 58 y.o.   MRN: [id number]  Chief Complaint  Patient presents with  . routine check up  . Nicotine Dependence    HPI: Christopher Thompson is a 58 y.o. male with PMH significant for HTN, HLD & COPD who presents for routine follow. He has no complaints today.  Feels he is doing well, lost a bit of weight, smoking less (intermittent cessation, but ends up having withdrawal sx & increased appetite).  Had 1 cigarette all day.   Hasn't used inhaler very much.   Declines colonoscopy, agrees to tdap. Reports he has had PFTs before (within the last 10 years)  Review of Systems: Constitutional: Denies fever, chills, diaphoresis, appetite change and fatigue.  HEENT: Denies photophobia, eye pain, redness, hearing loss, ear pain, congestion, sore throat, rhinorrhea, sneezing, mouth sores, trouble swallowing, neck pain, neck stiffness and tinnitus.  Respiratory: Denies SOB, DOE, cough, chest tightness, and wheezing.  Cardiovascular: Denies chest pain, palpitations and leg swelling.  Gastrointestinal: Denies nausea, vomiting, abdominal pain, diarrhea, constipation,blood in stool and abdominal distention.  Genitourinary: Denies dysuria, urgency, frequency, hematuria, flank pain and difficulty urinating.  Musculoskeletal: Denies myalgias, back pain, and gait problem. Chronic left shoulder pain - better with advil Skin: Denies pallor, rash and wound.  Neurological: Denies dizziness, seizures, syncope, weakness, lightheadedness, numbness and headaches.  Hematological: no obvious s/s of bleeding Psychiatric/Behavioral: Denies suicidal ideation, mood changes, confusion, nervousness, sleep disturbance and agitation   Past Medical History  Diagnosis Date  . Drug-induced hepatic toxicity 06/21/2007  . Learning disability 01/07/2007    Determined in court for disability  . Tobacco abuse 01/07/2007  . Sleep apnea 10/01/2006    mild to  moderate  . Eczema 10/01/2006  . Hypertension 10/01/2006  . Hyperlipidemia 10/01/2006  . COPD (chronic obstructive pulmonary disease) 10/01/2006   Current Outpatient Prescriptions  Medication Sig Dispense Refill  . amLODipine (NORVASC) 10 MG tablet TAKE 1 TABLET EVERY DAY  90 tablet  3  . ATROVENT HFA 17 MCG/ACT inhaler INHALE 2 PUFFS INTO THE LUNGS EVERY 4 HOURS AS NEEDED FOR SHORTNESS OF BREATH  2 Inhaler  3  . Fluticasone-Salmeterol (ADVAIR DISKUS) 250-50 MCG/DOSE AEPB TAKE 2 PUFFS TWICE A DAY  120 each  5  . losartan (COZAAR) 100 MG tablet TAKE 1 TABLET BY MOUTH EVERY DAY  30 tablet  3  . metoprolol succinate (TOPROL-XL) 100 MG 24 hr tablet TAKE 1 & 1/2 TABLET EVERY DAY  45 tablet  11  . pravastatin (PRAVACHOL) 80 MG tablet Take 1 tablet (80 mg total) by mouth daily.  90 tablet  1  . PROAIR HFA 108 (90 BASE) MCG/ACT inhaler INHALE 2 PUFFS INTO THE LUNGS EVERY 6 (SIX) HOURS AS NEEDED FOR WHEEZING.  8.5 each  6  . triamterene-hydrochlorothiazide (DYAZIDE) 37.5-25 MG per capsule Take 1 each (1 capsule total) by mouth every morning.  30 capsule  5   No current facility-administered medications for this visit.   Family History  Problem Relation Age of Onset  . Hypertension Mother   . Heart disease Father    History   Social History  . Marital Status: Legally Separated    Spouse Name: N/A    Number of Children: N/A  . Years of Education: 7th grade   Occupational History  . disability     due to learning disability per patient; can only do physical work, and hurt his left shoulder on the  job years ago   Social History Main Topics  . Smoking status: Current Every Day Smoker -- 0.30 packs/day    Types: Cigarettes  . Smokeless tobacco: None     Comment: cutting back "on my own"  . Alcohol Use: No  . Drug Use: No  . Sexual Activity: None   Other Topics Concern  . None   Social History Narrative  . None    Objective:  Physical Exam: Filed Vitals:   03/18/14 1546  BP:  130/86  Pulse: 94  Temp: 97.6 F (36.4 C)  TempSrc: Oral  Weight: 235 lb 4.8 oz (106.731 kg)  SpO2: 96%   General: no acute distress, pleasant, appears as stated age HEENT: PERRL, EOMI, no scleral icterus Cardiac: RRR, no rubs, murmurs or gallops Pulm: clear to auscultation bilaterally, moving normal volumes of air Abd: soft, nontender, nondistended, BS present Ext: warm and well perfused, no pedal edema Neuro: alert and oriented X3, cranial nerves II-XII grossly intact  Assessment & Plan:   Case and care discussed with Dr. Rogelia BogaButcher.  Please see problem oriented charting for further details. Patient to return in 3 months for blood pressure follow up.

## 2014-03-23 NOTE — Progress Notes (Signed)
Case discussed with Dr. Sharda soon after the resident saw the patient.  We reviewed the resident's history and exam and pertinent patient test results.  I agree with the assessment, diagnosis, and plan of care documented in the resident's note. 

## 2014-03-26 ENCOUNTER — Encounter: Payer: Self-pay | Admitting: Internal Medicine

## 2014-03-27 ENCOUNTER — Other Ambulatory Visit: Payer: Self-pay | Admitting: Internal Medicine

## 2014-03-27 DIAGNOSIS — J449 Chronic obstructive pulmonary disease, unspecified: Secondary | ICD-10-CM

## 2014-03-30 ENCOUNTER — Other Ambulatory Visit: Payer: Self-pay | Admitting: *Deleted

## 2014-03-30 NOTE — Telephone Encounter (Signed)
meds reordered

## 2014-05-23 ENCOUNTER — Other Ambulatory Visit: Payer: Self-pay | Admitting: Internal Medicine

## 2014-06-10 ENCOUNTER — Ambulatory Visit (INDEPENDENT_AMBULATORY_CARE_PROVIDER_SITE_OTHER): Payer: Medicare Other | Admitting: Internal Medicine

## 2014-06-10 ENCOUNTER — Encounter: Payer: Self-pay | Admitting: Internal Medicine

## 2014-06-10 VITALS — BP 149/93 | HR 94 | Temp 97.0°F | Wt 239.5 lb

## 2014-06-10 DIAGNOSIS — J4489 Other specified chronic obstructive pulmonary disease: Secondary | ICD-10-CM

## 2014-06-10 DIAGNOSIS — I10 Essential (primary) hypertension: Secondary | ICD-10-CM

## 2014-06-10 DIAGNOSIS — Z0000 Encounter for general adult medical examination without abnormal findings: Secondary | ICD-10-CM | POA: Diagnosis not present

## 2014-06-10 DIAGNOSIS — J449 Chronic obstructive pulmonary disease, unspecified: Secondary | ICD-10-CM

## 2014-06-10 MED ORDER — TRIAMTERENE-HCTZ 37.5-25 MG PO CAPS: 1.0000 | ORAL_CAPSULE | ORAL | Status: DC

## 2014-06-10 MED ORDER — CARVEDILOL 25 MG PO TABS: 25.0000 mg | ORAL_TABLET | Freq: Two times a day (BID) | ORAL | Status: DC

## 2014-06-10 NOTE — Patient Instructions (Signed)
General Instructions: -Your blood pressure is a little higher than we would like - please discontinue metoprolol and start coreg (also called carvedilol 25mg  TWICE daily) -- If you want to finish your current supply of metoprolol, you can start coreg once you finish your old pills.  - after starting your new medication (coreg), if you have dizziness, light headedness, increased lethargy, please let us know.  -Today I am going to check your kidney and liver function.  Thank you for bringing your medicines today. This helps us keep you safe from mistakes.  Please be sure to bring all of your medications with you to every visit.  Should you have any new or worsening symptoms, please be sure to call the clinic at [phone].  Progress Toward Treatment Goals:  Treatment Goal 06/10/2014  Blood pressure unchanged  Stop smoking smoking less    Self Care Goals & Plans:  Self Care Goal 06/10/2014  Manage my medications -  Monitor my health -  Eat healthy foods -  Be physically active -  Stop smoking go to the [employer] website ([email or web address].[email or web address])  Meeting treatment goals maintain the current self-care plan    Care Management & Community Referrals:  Referral 03/18/2014  Referrals made for care management support none needed

## 2014-06-10 NOTE — Progress Notes (Signed)
Subjective:   Patient ID: Christopher Thompson male   DOB:Christopher Thompson 1956/01/20 58 y.o.   MRN: [id number]  Chief Complaint  Patient presents with  . routine follow up    HPI: [name] is a 58 y.o. male with PMH significant for HTN, HLD & COPD who presents for routine follow. He has no complaints   Down to smoking 3 cig/day, plans to get nicotine gum when can afford.  Needs refill of dyazide.   Declines colonoscopy until est with next MD.   Review of Systems:  Constitutional: Denies fever, chills, diaphoresis, appetite change and fatigue.  HEENT: Denies photophobia, eye pain, redness, hearing loss, ear pain, congestion, sore throat, rhinorrhea, sneezing, mouth sores, trouble swallowing, neck pain, neck stiffness and tinnitus.  Respiratory: Denies SOB, DOE, cough, chest tightness, and wheezing.  Cardiovascular: Denies chest pain, palpitations and leg swelling.  Gastrointestinal: Denies nausea, vomiting, abdominal pain, diarrhea, constipation,blood in stool and abdominal distention.  Genitourinary: Denies dysuria, urgency, frequency, hematuria, flank pain and difficulty urinating.  Musculoskeletal: Denies myalgias, back pain, and gait problem. Chronic left shoulder pain - better with advil/motrin Skin: Denies pallor, rash and wound.  Neurological: Denies dizziness, seizures, syncope, weakness, lightheadedness, numbness and headaches.  Hematological: no obvious s/s of bleeding  Psychiatric/Behavioral: Denies suicidal ideation, mood changes, confusion, nervousness, sleep disturbance and agitation    Past Medical History  Diagnosis Date  . Drug-induced hepatic toxicity 06/21/2007  . Learning disability 01/07/2007    Determined in court for disability  . Tobacco abuse 01/07/2007  . Sleep apnea 10/01/2006    mild to moderate  . Eczema 10/01/2006  . Hypertension 10/01/2006  . Hyperlipidemia 10/01/2006  . COPD (chronic obstructive pulmonary disease) 10/01/2006   Current Outpatient  Prescriptions  Medication Sig Dispense Refill  . ADVAIR DISKUS 250-50 MCG/DOSE AEPB TAKE 2 PUFFS TWICE A DAY  120 each  4  . amLODipine (NORVASC) 10 MG tablet TAKE 1 TABLET EVERY DAY  90 tablet  3  . ATROVENT HFA 17 MCG/ACT inhaler INHALE 2 PUFFS INTO THE LUNGS EVERY 4 HOURS AS NEEDED FOR SHORTNESS OF BREATH  2 Inhaler  3  . losartan (COZAAR) 100 MG tablet TAKE 1 TABLET BY MOUTH EVERY DAY  30 tablet  3  . metoprolol succinate (TOPROL-XL) 100 MG 24 hr tablet TAKE 1 & 1/2 TABLET EVERY DAY  45 tablet  11  . pravastatin (PRAVACHOL) 80 MG tablet TAKE 1 TABLET (80 MG TOTAL) BY MOUTH DAILY.  90 tablet  1  . PROAIR HFA 108 (90 BASE) MCG/ACT inhaler INHALE 2 PUFFS INTO THE LUNGS EVERY 6 (SIX) HOURS AS NEEDED FOR WHEEZING.  8.5 each  6  . triamterene-hydrochlorothiazide (DYAZIDE) 37.5-25 MG per capsule Take 1 each (1 capsule total) by mouth every morning.  30 capsule  5   No current facility-administered medications for this visit.   Family History  Problem Relation Age of Onset  . Hypertension Mother   . Heart disease Father    History   Social History  . Marital Status: Legally Separated    Spouse Name: N/A    Number of Children: N/A  . Years of Education: 7th grade   Occupational History  . disability     due to learning disability per patient; can only do physical work, and hurt his left shoulder on the job years ago   Social History Main Topics  . Smoking status: Current Every Day Smoker -- 0.30 packs/day    Types: Cigarettes  . Smokeless  tobacco: None     Comment: cutting back "on my own"  . Alcohol Use: No  . Drug Use: No  . Sexual Activity: None   Other Topics Concern  . None   Social History Narrative  . None    Objective:  Physical Exam: Filed Vitals:   06/10/14 1434  BP: 149/93  Pulse: 94  Temp: 97 F (36.1 C)  TempSrc: Oral  Weight: 239 lb 8 oz (108.636 kg)  SpO2: 97%   General: no acute distress, pleasant, appears as stated age  HEENT: PERRL, EOMI, no  scleral icterus  Cardiac: RRR, no rubs, murmurs or gallops  Pulm: clear to auscultation bilaterally, moving normal volumes of air  Abd: soft, nontender, nondistended, BS present  Ext: warm and well perfused, no pedal edema  Neuro: alert and oriented X3, cranial nerves II-XII grossly intact  Assessment & Plan:  Case and care discussed with Dr. Rogelia BogaButcher.  Please see problem oriented charting for further details. Patient to return in 3 months for routine.

## 2014-06-10 NOTE — Assessment & Plan Note (Signed)
Stable, rarely uses albuterol.  Daily use of advair & atrovent

## 2014-06-10 NOTE — Assessment & Plan Note (Addendum)
BP Readings from Last 3 Encounters:  06/10/14 149/93  03/18/14 130/86  11/12/13 143/91    Lab Results  Component Value Date   NA 138 11/12/2013   K 3.8 11/12/2013   CREATININE 1.15 11/12/2013    Assessment: Blood pressure control: mildly elevated Progress toward BP goal:  unchanged  Plan: Medications:  cont dyazide, amlodipine and losartan (max doses) - chamge metoprolol to coreg 25 bid  - hopeful this will allow better BP control Other plans: cmet today To note,  I called pharmacy, losartan was filled on 05/26/14, almodipine was filled 03/22/14 (#90), metoprolol hasn't been filled since 09/2013.

## 2014-06-10 NOTE — Progress Notes (Signed)
Case discussed with Dr. Sharda soon after the resident saw the patient.  We reviewed the resident's history and exam and pertinent patient test results.  I agree with the assessment, diagnosis, and plan of care documented in the resident's note. 

## 2014-06-11 LAB — COMPREHENSIVE METABOLIC PANEL
ALK PHOS: 108 U/L (ref 39–117)
ALT: 20 U/L (ref 0–53)
AST: 25 U/L (ref 0–37)
Albumin: 4.5 g/dL (ref 3.5–5.2)
BILIRUBIN TOTAL: 0.5 mg/dL (ref 0.2–1.2)
BUN: 13 mg/dL (ref 6–23)
CO2: 26 meq/L (ref 19–32)
CREATININE: 1.24 mg/dL (ref 0.50–1.35)
Calcium: 9.5 mg/dL (ref 8.4–10.5)
Chloride: 104 mEq/L (ref 96–112)
GLUCOSE: 103 mg/dL — AB (ref 70–99)
Potassium: 3.9 mEq/L (ref 3.5–5.3)
Sodium: 138 mEq/L (ref 135–145)
Total Protein: 7.7 g/dL (ref 6.0–8.3)

## 2014-06-24 ENCOUNTER — Other Ambulatory Visit: Payer: Self-pay | Admitting: Internal Medicine

## 2014-07-11 ENCOUNTER — Other Ambulatory Visit: Payer: Self-pay | Admitting: Internal Medicine

## 2014-07-13 NOTE — Telephone Encounter (Signed)
Needs Sep or Oct appt PCP for routine F/U.

## 2014-07-23 ENCOUNTER — Other Ambulatory Visit: Payer: Self-pay | Admitting: *Deleted

## 2014-07-23 MED ORDER — LOSARTAN POTASSIUM 100 MG PO TABS
100.0000 mg | ORAL_TABLET | Freq: Every day | ORAL | Status: DC
Start: 2014-07-23 — End: 2015-10-06

## 2014-07-23 MED ORDER — TRIAMTERENE-HCTZ 37.5-25 MG PO CAPS: 1.0000 | ORAL_CAPSULE | ORAL | Status: DC

## 2014-07-23 NOTE — Telephone Encounter (Signed)
He just got a Rx for coreg with 11 refills, should not need new Rx.

## 2014-07-23 NOTE — Telephone Encounter (Signed)
Pt has refills on all these meds but pharmacy is requesting a 90 day supply.  Cost effective. Please resend for # 90 and # 180

## 2014-09-23 ENCOUNTER — Ambulatory Visit (INDEPENDENT_AMBULATORY_CARE_PROVIDER_SITE_OTHER): Payer: Medicare Other | Admitting: Internal Medicine

## 2014-09-23 ENCOUNTER — Encounter: Payer: Self-pay | Admitting: Internal Medicine

## 2014-09-23 VITALS — BP 149/96 | HR 86 | Temp 98.3°F | Ht 71.0 in | Wt 234.0 lb

## 2014-09-23 DIAGNOSIS — E785 Hyperlipidemia, unspecified: Secondary | ICD-10-CM

## 2014-09-23 DIAGNOSIS — I10 Essential (primary) hypertension: Secondary | ICD-10-CM | POA: Diagnosis not present

## 2014-09-23 DIAGNOSIS — J449 Chronic obstructive pulmonary disease, unspecified: Secondary | ICD-10-CM

## 2014-09-23 DIAGNOSIS — J4489 Other specified chronic obstructive pulmonary disease: Secondary | ICD-10-CM | POA: Diagnosis not present

## 2014-09-23 DIAGNOSIS — Z23 Encounter for immunization: Secondary | ICD-10-CM

## 2014-09-23 DIAGNOSIS — Z0000 Encounter for general adult medical examination without abnormal findings: Secondary | ICD-10-CM | POA: Diagnosis not present

## 2014-09-23 DIAGNOSIS — F17200 Nicotine dependence, unspecified, uncomplicated: Secondary | ICD-10-CM | POA: Diagnosis not present

## 2014-09-23 LAB — LIPID PANEL
CHOLESTEROL: 153 mg/dL (ref 0–200)
HDL: 37 mg/dL — AB (ref >39)
LDL CALC: 86 mg/dL (ref 0–99)
TRIGLYCERIDES: 151 mg/dL — AB (ref <150)
Total CHOL/HDL Ratio: 4.1 Ratio
VLDL: 30 mg/dL (ref 0–40)

## 2014-09-23 LAB — BASIC METABOLIC PANEL
BUN: 13 mg/dL (ref 6–23)
CHLORIDE: 106 meq/L (ref 96–112)
CO2: 23 mEq/L (ref 19–32)
CREATININE: 1.16 mg/dL (ref 0.50–1.35)
Calcium: 9.3 mg/dL (ref 8.4–10.5)
Glucose, Bld: 93 mg/dL (ref 70–99)
Potassium: 4.2 mEq/L (ref 3.5–5.3)
Sodium: 141 mEq/L (ref 135–145)

## 2014-09-23 MED ORDER — IPRATROPIUM BROMIDE HFA 17 MCG/ACT IN AERS: INHALATION_SPRAY | RESPIRATORY_TRACT | Status: DC

## 2014-09-23 MED ORDER — METOPROLOL SUCCINATE ER 100 MG PO TB24: 100.0000 mg | ORAL_TABLET | Freq: Every day | ORAL | Status: DC

## 2014-09-23 MED ORDER — FLUTICASONE-SALMETEROL 250-50 MCG/DOSE IN AEPB: INHALATION_SPRAY | RESPIRATORY_TRACT | Status: DC

## 2014-09-23 MED ORDER — ALBUTEROL SULFATE HFA 108 (90 BASE) MCG/ACT IN AERS: INHALATION_SPRAY | RESPIRATORY_TRACT | Status: DC

## 2014-09-23 NOTE — Assessment & Plan Note (Signed)
Last LDL was 116 on 10/2013. Currently taking pravastatin 80mg  daily. Will check LDL today.

## 2014-09-23 NOTE — Assessment & Plan Note (Signed)
BP Readings from Last 3 Encounters:  09/23/14 149/96  06/10/14 149/93  03/18/14 130/86    Lab Results  Component Value Date   NA 138 06/10/2014   K 3.9 06/10/2014   CREATININE 1.24 06/10/2014    Assessment: Blood pressure control:  fair Progress toward BP goal:   progressing towards goal Comments: pt was switched from metoprolol 100mg  to coreg 25mg  BID during his last visit in June and he is requesting to go back to metoprol.   Plan: Medications:  pt's blood pressures have been well controlled w/ metoprolol in the past, will switch pt back to metoprolol 100mg  once a day.  Continue losartan 100mg ,  dyazide, and norvasc 10mg  Other plans: advised pt to start exercising for at least 30 mins 3 days/ week. Pt states he is going to start exercising which will also help w/ BP control

## 2014-09-23 NOTE — Progress Notes (Signed)
Subjective:     Patient ID: Christopher Thompson, male   DOB: [birth year], 58 y.o.   MRN: [id number]  HPI Pt is a 58 y/o male w/ PMHx of COPD, HTN, HLD, and OSA who presents to clinic for routine check up. Pt has no complaints and reports that he quit smoking Saturday. Pt is very enthusiastic and motivated about smoking cessation. He plans to start exercising again beginning tomorrow. Pt would like to go back to metoprolol 100mg  for his blood pressure.     Review of Systems  Respiratory: Negative for shortness of breath and wheezing.   Cardiovascular: Negative for chest pain.  Neurological: Negative for headaches.       Objective:   Physical Exam  Constitutional: He appears well-developed and well-nourished. No distress.  HENT:  Head: Normocephalic.  Right Ear: External ear normal.  Left Ear: External ear normal.  Eyes: Conjunctivae are normal. Pupils are equal, round, and reactive to light.  Cardiovascular: Normal rate and regular rhythm.   No murmur heard. Pulmonary/Chest: Effort normal and breath sounds normal. He has no wheezes.  Abdominal: Soft. Bowel sounds are normal. There is tenderness.  Musculoskeletal: He exhibits no edema.       Assessment:     Please see problem based assessment and plan.       Plan:     Please see problem based assessment and plan.

## 2014-09-23 NOTE — Assessment & Plan Note (Signed)
Pt quit smoking 4 days ago and reports improvement in breathing and better exercise tolerance. He is rarely using his inhalers since then. Will give refills of atrovent, advair, and proair inhalers.

## 2014-09-25 NOTE — Progress Notes (Signed)
INTERNAL MEDICINE TEACHING ATTENDING ADDENDUM - Earl LagosNischal Elienai Gailey, MD: I personally saw and evaluated Mr. Christopher PeaHarris in this clinic visit in conjunction with the resident, Dr. Danella Pentonruong. I have discussed patient's plan of care with medical resident during this visit. I have confirmed the physical exam findings and have read and agree with the clinic note including the plan with the following addition: - pt congratulated on smoking cessation - c/w current BP meds. Pt counseled about importance of exercise

## 2014-12-24 ENCOUNTER — Other Ambulatory Visit: Payer: Self-pay | Admitting: Internal Medicine

## 2015-01-27 ENCOUNTER — Encounter: Payer: Self-pay | Admitting: *Deleted

## 2015-02-24 ENCOUNTER — Encounter: Payer: Medicare Other | Admitting: Internal Medicine

## 2015-03-03 ENCOUNTER — Encounter: Payer: Medicare Other | Admitting: Internal Medicine

## 2015-03-28 ENCOUNTER — Other Ambulatory Visit: Payer: Self-pay | Admitting: Internal Medicine

## 2015-03-28 DIAGNOSIS — J439 Emphysema, unspecified: Secondary | ICD-10-CM

## 2015-04-27 ENCOUNTER — Other Ambulatory Visit: Payer: Self-pay | Admitting: Internal Medicine

## 2015-04-27 ENCOUNTER — Telehealth: Payer: Self-pay | Admitting: Internal Medicine

## 2015-04-27 NOTE — Telephone Encounter (Signed)
Call to patient to confirm appointment for 04/28/15 at 1:15 lmtcb ° °

## 2015-04-28 ENCOUNTER — Ambulatory Visit (INDEPENDENT_AMBULATORY_CARE_PROVIDER_SITE_OTHER): Payer: Medicare Other | Admitting: Internal Medicine

## 2015-04-28 ENCOUNTER — Encounter: Payer: Self-pay | Admitting: Internal Medicine

## 2015-04-28 VITALS — BP 156/88 | HR 99 | Temp 98.3°F | Ht 71.0 in | Wt 234.1 lb

## 2015-04-28 DIAGNOSIS — I10 Essential (primary) hypertension: Secondary | ICD-10-CM

## 2015-04-28 NOTE — Progress Notes (Signed)
Medicine attending: Medical history, presenting problems, physical findings, and medications, reviewed with Dr Diana Truong and I concur with her evaluation and management plan. 

## 2015-04-28 NOTE — Assessment & Plan Note (Signed)
BP Readings from Last 3 Encounters:  04/28/15 156/88  09/23/14 149/96  06/10/14 149/93    Lab Results  Component Value Date   NA 141 09/23/2014   K 4.2 09/23/2014   CREATININE 1.16 09/23/2014    Assessment: Blood pressure control:  elevated Progress toward BP goal:   near goal Comments: pt brought in medications, on norvasc, lopressor, losartan, and HCTZ. Also on coreg 25mg  BID which was last prescribed to him in June 2015 and was not on his med list today. He states has been taking it regularly. BP 120/74 on recheck.   Plan: Medications:  continue current medications including coreg. Can increase lopressor dose to 50mg  BID and stop coreg at next visit in 3 months.  Other plans: advised to continue exercise and weight loss as it will improve BP and OSA

## 2015-04-28 NOTE — Progress Notes (Signed)
   Subjective:    Patient ID: Christopher SniderJohn D Thompson, male    DOB: 1956-06-01, 59 y.o.   MRN: [id number]  HPI Pt is a 59 y/o male w/ PMHx of HLD, HTN, and OSA who presents to clinic for HTN f/u. He has no complaints today, denies need for medication refills. He is very enthusiastic about his lifestyle changes including smoking cessation. States his SOB and exercise tolerance have improved b/c of this. He now lives on the 2nd floor of his apt building which as helped with his exercise. Pt knows that he is due for a colonoscopy but would like to f/u in 3 months and decide when he wants to do it then.      Review of Systems  Constitutional: Negative for unexpected weight change.  Respiratory: Negative for shortness of breath and wheezing.   All other systems reviewed and are negative.      Objective:   Physical Exam  Constitutional: He is oriented to person, place, and time. He appears well-developed and well-nourished.  HENT:  Head: Normocephalic.  Cardiovascular: Normal rate and regular rhythm.   Pulmonary/Chest: Effort normal and breath sounds normal.  Abdominal: Soft. Bowel sounds are normal. He exhibits distension. There is no tenderness.  Neurological: He is alert and oriented to person, place, and time.  Skin: Skin is warm and dry.  Psychiatric: He has a normal mood and affect.          Assessment & Plan:

## 2015-08-23 ENCOUNTER — Other Ambulatory Visit: Payer: Self-pay | Admitting: Internal Medicine

## 2015-08-26 ENCOUNTER — Telehealth: Payer: Self-pay | Admitting: *Deleted

## 2015-08-26 DIAGNOSIS — I10 Essential (primary) hypertension: Secondary | ICD-10-CM

## 2015-08-26 NOTE — Telephone Encounter (Signed)
Pharmacy [facility] is requesting 90 day supply Metoprolol ER  tab. Stanton Kidney Marajade Lei RN 08/26/15 1:40PM

## 2015-08-27 ENCOUNTER — Other Ambulatory Visit: Payer: Self-pay | Admitting: Internal Medicine

## 2015-08-27 MED ORDER — METOPROLOL SUCCINATE ER 100 MG PO TB24: 100.0000 mg | ORAL_TABLET | Freq: Every day | ORAL | Status: DC

## 2015-08-27 NOTE — Telephone Encounter (Signed)
Also pharmacy is requesting 90 day supply Metoprolol ER  tab. Stanton Kidney Lamount Bankson RN 08/27/15 3:55PM

## 2015-09-22 ENCOUNTER — Other Ambulatory Visit: Payer: Self-pay | Admitting: Internal Medicine

## 2015-09-28 ENCOUNTER — Other Ambulatory Visit: Payer: Self-pay | Admitting: Internal Medicine

## 2015-09-29 ENCOUNTER — Ambulatory Visit (INDEPENDENT_AMBULATORY_CARE_PROVIDER_SITE_OTHER): Payer: Medicare Other | Admitting: Internal Medicine

## 2015-09-29 ENCOUNTER — Encounter: Payer: Self-pay | Admitting: Internal Medicine

## 2015-09-29 VITALS — BP 143/87 | HR 87 | Temp 98.8°F | Ht 71.0 in | Wt 234.2 lb

## 2015-09-29 DIAGNOSIS — J439 Emphysema, unspecified: Secondary | ICD-10-CM | POA: Diagnosis not present

## 2015-09-29 DIAGNOSIS — Z0000 Encounter for general adult medical examination without abnormal findings: Secondary | ICD-10-CM

## 2015-09-29 DIAGNOSIS — F17200 Nicotine dependence, unspecified, uncomplicated: Secondary | ICD-10-CM

## 2015-09-29 DIAGNOSIS — Z23 Encounter for immunization: Secondary | ICD-10-CM

## 2015-09-29 DIAGNOSIS — I10 Essential (primary) hypertension: Secondary | ICD-10-CM

## 2015-09-29 MED ORDER — IPRATROPIUM BROMIDE HFA 17 MCG/ACT IN AERS: INHALATION_SPRAY | RESPIRATORY_TRACT | Status: DC

## 2015-09-29 MED ORDER — ZOSTER VACCINE LIVE 19400 UNT/0.65ML ~~LOC~~ SOLR: 0.6500 mL | Freq: Once | SUBCUTANEOUS | Status: DC

## 2015-09-29 NOTE — Assessment & Plan Note (Signed)
Quit smoking last year but has resumed occasionally smoking, around 3 ciggarette per day when he does. Advised to stop smoking completely which he will try to do.

## 2015-09-29 NOTE — Progress Notes (Signed)
   Subjective:    Patient ID: Christopher Thompson, male    DOB: 1956-11-08, 59 y.o.   MRN: [id number]  HPI 59 y/o M w/ PMHx of tobacco abuse, emphysema, HLD, and HTN who presents to clinic for f/u and medication refill. Please see problem list for further details.      Review of Systems  Constitutional: Negative for fever, activity change and appetite change.  Respiratory: Negative for shortness of breath and wheezing.   Cardiovascular: Negative for chest pain.  Gastrointestinal: Negative for abdominal pain.  Musculoskeletal: Negative.   Psychiatric/Behavioral: Negative.        Objective:   Physical Exam  Constitutional: He appears well-developed and well-nourished. No distress.  HENT:  Head: Normocephalic.  Cardiovascular: Normal rate, regular rhythm and normal heart sounds.   No murmur heard. Pulmonary/Chest: Effort normal and breath sounds normal. No respiratory distress. He has no wheezes. He has no rales.  Abdominal: Soft. Bowel sounds are normal. He exhibits no distension. There is no tenderness.  Neurological: He is alert.  Skin: Skin is warm and dry.          Assessment & Plan:  Please see problem based assessment and plan.

## 2015-09-29 NOTE — Assessment & Plan Note (Signed)
Declines colonoscopy. Given stool cards. Rx sent for zostavax.

## 2015-09-29 NOTE — Assessment & Plan Note (Signed)
BP Readings from Last 3 Encounters:  09/29/15 143/87  04/28/15 156/88  09/23/14 149/96    Lab Results  Component Value Date   NA 141 09/23/2014   K 4.2 09/23/2014   CREATININE 1.16 09/23/2014    Assessment: Blood pressure control:  controlled Progress toward BP goal:   at goal Comments: on dyazide 35.7-25mg  daily, toprol xl  ER, losartan  daily and norvasc . Also on coreg  BID. BP usually elevated on arrival to clinic but decreased on recheck down to SBP 120's  Plan: Medications:  Continue above medications except for coreg which was d/c as does not need to be on 2 beta blockers. Can increase toprol to  daily if BP elevated at next visit.  Educational resources provided:   Self management tools provided:   Other plans: f/u in 6 months.

## 2015-09-29 NOTE — Assessment & Plan Note (Signed)
Refilled atrovent. Pt denies SOB but prefers to have one on hand. Exercises 3x / week w/o any issues.

## 2015-09-30 NOTE — Progress Notes (Signed)
Medicine attending: Medical history, presenting problems, physical findings, and medications, reviewed with Dr Diana Truong and I concur with her evaluation and management plan. 

## 2015-10-06 ENCOUNTER — Other Ambulatory Visit: Payer: Self-pay | Admitting: *Deleted

## 2015-10-07 MED ORDER — LOSARTAN POTASSIUM 100 MG PO TABS: 100.0000 mg | ORAL_TABLET | Freq: Every day | ORAL | Status: DC

## 2015-12-29 ENCOUNTER — Other Ambulatory Visit: Payer: Self-pay

## 2015-12-29 DIAGNOSIS — J439 Emphysema, unspecified: Secondary | ICD-10-CM

## 2015-12-29 MED ORDER — IPRATROPIUM BROMIDE HFA 17 MCG/ACT IN AERS: INHALATION_SPRAY | RESPIRATORY_TRACT | Status: DC

## 2015-12-30 ENCOUNTER — Other Ambulatory Visit: Payer: Self-pay | Admitting: Internal Medicine

## 2016-01-22 ENCOUNTER — Other Ambulatory Visit: Payer: Self-pay | Admitting: Internal Medicine

## 2016-01-23 ENCOUNTER — Other Ambulatory Visit: Payer: Self-pay | Admitting: Internal Medicine

## 2016-02-19 ENCOUNTER — Other Ambulatory Visit: Payer: Self-pay | Admitting: Internal Medicine

## 2016-05-25 ENCOUNTER — Other Ambulatory Visit: Payer: Self-pay | Admitting: Internal Medicine

## 2016-06-28 ENCOUNTER — Ambulatory Visit (INDEPENDENT_AMBULATORY_CARE_PROVIDER_SITE_OTHER): Payer: Medicare Other | Admitting: Internal Medicine

## 2016-06-28 VITALS — BP 140/97 | HR 97 | Temp 98.2°F | Ht 71.0 in | Wt 239.6 lb

## 2016-06-28 DIAGNOSIS — I10 Essential (primary) hypertension: Secondary | ICD-10-CM

## 2016-06-28 DIAGNOSIS — E785 Hyperlipidemia, unspecified: Secondary | ICD-10-CM | POA: Diagnosis not present

## 2016-06-28 DIAGNOSIS — Z7289 Other problems related to lifestyle: Secondary | ICD-10-CM | POA: Diagnosis not present

## 2016-06-28 DIAGNOSIS — Z114 Encounter for screening for human immunodeficiency virus [HIV]: Secondary | ICD-10-CM | POA: Diagnosis not present

## 2016-06-28 DIAGNOSIS — Z23 Encounter for immunization: Secondary | ICD-10-CM

## 2016-06-28 DIAGNOSIS — Z7251 High risk heterosexual behavior: Secondary | ICD-10-CM | POA: Diagnosis not present

## 2016-06-28 DIAGNOSIS — F17210 Nicotine dependence, cigarettes, uncomplicated: Secondary | ICD-10-CM | POA: Diagnosis not present

## 2016-06-28 DIAGNOSIS — Z0000 Encounter for general adult medical examination without abnormal findings: Secondary | ICD-10-CM

## 2016-06-28 DIAGNOSIS — R739 Hyperglycemia, unspecified: Secondary | ICD-10-CM | POA: Insufficient documentation

## 2016-06-28 DIAGNOSIS — R635 Abnormal weight gain: Secondary | ICD-10-CM | POA: Insufficient documentation

## 2016-06-28 LAB — POCT GLYCOSYLATED HEMOGLOBIN (HGB A1C): Hemoglobin A1C: 5.6

## 2016-06-28 LAB — GLUCOSE, CAPILLARY: GLUCOSE-CAPILLARY: 106 mg/dL — AB (ref 65–99)

## 2016-06-28 MED ORDER — METOPROLOL SUCCINATE ER 200 MG PO TB24: 200.0000 mg | ORAL_TABLET | Freq: Every day | ORAL | Status: DC

## 2016-06-28 MED ORDER — PNEUMOCOCCAL VAC POLYVALENT 25 MCG/0.5ML IJ INJ
0.5000 mL | INJECTION | INTRAMUSCULAR | Status: AC
  Administered 2016-06-28: 0.5 mL via INTRAMUSCULAR

## 2016-06-28 MED ORDER — PNEUMOCOCCAL VAC POLYVALENT 25 MCG/0.5ML IJ INJ: 0.5000 mL | INJECTION | INTRAMUSCULAR | Status: DC

## 2016-06-28 MED ORDER — TRIAMTERENE-HCTZ 37.5-25 MG PO CAPS: 1.0000 | ORAL_CAPSULE | ORAL | Status: DC

## 2016-06-28 NOTE — Progress Notes (Signed)
Subjective:   Patient ID: Christopher Thompson male   DOB: 7/25Tamsen Thompson/1957 60 y.o.   MRN: [id number]  HPI: [name] is a 60 y.o. with past medical history as outlined below who presents to clinic for HTN follow up.   Please see problem list for status of the pt's chronic medical problems.  Past Medical History  Diagnosis Date  . Drug-induced hepatic toxicity 06/21/2007  . Learning disability 01/07/2007    Determined in court for disability  . Tobacco abuse 01/07/2007  . Sleep apnea 10/01/2006    mild to moderate  . Eczema 10/01/2006  . Hypertension 10/01/2006  . Hyperlipidemia 10/01/2006  . COPD (chronic obstructive pulmonary disease) (HCC) 10/01/2006   Current Outpatient Prescriptions  Medication Sig Dispense Refill  . ADVAIR DISKUS 250-50 MCG/DOSE AEPB TAKE 2 PUFFS TWICE A DAY 120 each 3  . amLODipine (NORVASC) 10 MG tablet TAKE 1 TABLET BY MOUTH EVERY DAY 90 tablet 4  . ATROVENT HFA 17 MCG/ACT inhaler INHALE 2 PUFFS INTO THE LUNGS EVERY 6 (SIX) HOURS AS NEEDED. 12.9 Inhaler 3  . carvedilol (COREG) 25 MG tablet TAKE 1 TABLET BY MOUTH TWICE A DAY 180 tablet 3  . ipratropium (ATROVENT HFA) 17 MCG/ACT inhaler INHALE 2 PUFFS INTO THE LUNGS EVERY 6 (SIX) HOURS AS NEEDED. 12.9 Inhaler 6  . losartan (COZAAR) 100 MG tablet Take 1 tablet (100 mg total) by mouth daily. 90 tablet 3  . metoprolol succinate (TOPROL XL) 100 MG 24 hr tablet Take 1 tablet (100 mg total) by mouth daily. Take with or immediately following a meal. 90 tablet 4  . pravastatin (PRAVACHOL) 80 MG tablet TAKE 1 TABLET (80 MG TOTAL) BY MOUTH DAILY. 90 tablet 3  . PROVENTIL HFA 108 (90 BASE) MCG/ACT inhaler INHALE 2 PUFFS INTO THE LUNGS EVERY 6 (SIX) HOURS AS NEEDED FOR WHEEZING. 8.5 Inhaler 5  . triamterene-hydrochlorothiazide (DYAZIDE) 37.5-25 MG per capsule Take 1 each (1 capsule total) by mouth every morning. 30 capsule 5  . VENTOLIN HFA 108 (90 Base) MCG/ACT inhaler INHALE 2 PUFFS INTO THE LUNGS EVERY 6 (SIX) HOURS AS  NEEDED FOR WHEEZING. 18 Inhaler 5  . VENTOLIN HFA 108 (90 Base) MCG/ACT inhaler INHALE 2 PUFFS INTO THE LUNGS EVERY 6 (SIX) HOURS AS NEEDED FOR WHEEZING. 18 Inhaler 5  . zoster vaccine live, PF, (ZOSTAVAX) [id number] UNT/0.65ML injection Inject 19,400 Units into the skin once. 1 each 0   No current facility-administered medications for this visit.   Family History  Problem Relation Age of Onset  . Hypertension Mother   . Heart disease Father    Social History   Social History  . Marital Status: Legally Separated    Spouse Name: N/A  . Number of Children: N/A  . Years of Education: 7th grade   Occupational History  . disability     due to learning disability per patient; can only do physical work, and hurt his left shoulder on the job years ago   Social History Main Topics  . Smoking status: Current Some Day Smoker -- 0.30 packs/day    Types: Cigarettes    Last Attempt to Quit: 09/19/2014  . Smokeless tobacco: Not on file     Comment: 1-2 CIGARETTES A DAY  . Alcohol Use: No  . Drug Use: No  . Sexual Activity: Not on file   Other Topics Concern  . Not on file   Social History Narrative   Review of Systems: General: denies fatigue Cardiac: - for CP  Pulm: - for SOB GI: denies BRBPR, melena, n/v  Objective:  Physical Exam: Filed Vitals:   06/28/16 1316  BP: 140/97  Pulse: 97  Temp: 98.2 F (36.8 C)  TempSrc: Oral  Height: 5\' 11"  (1.803 m)  Weight: 239 lb 9.6 oz (108.682 kg)  SpO2: 100%   General: NAD, pleasant Lungs: CTAB, no wheezing or crackles Cardiac: RRR, no murmurs/rubs/gallops GI: soft, active bowel sounds, no masses, non TTP Neuro: CN II-XII grossly intact Skin: warm and dry  Assessment & Plan:   Please see problem based assessment and plan.

## 2016-06-28 NOTE — Patient Instructions (Signed)
STOP taking Coreg.   Start taking metoprolol 200mg  daily.

## 2016-06-29 LAB — BMP8+ANION GAP
ANION GAP: 18 mmol/L (ref 10.0–18.0)
BUN / CREAT RATIO: 10 (ref 10–24)
BUN: 11 mg/dL (ref 8–27)
CHLORIDE: 102 mmol/L (ref 96–106)
CO2: 21 mmol/L (ref 18–29)
Calcium: 9.8 mg/dL (ref 8.6–10.2)
Creatinine, Ser: 1.08 mg/dL (ref 0.76–1.27)
GFR calc Af Amer: 86 mL/min/{1.73_m2} (ref >59)
GFR calc non Af Amer: 74 mL/min/{1.73_m2} (ref >59)
GLUCOSE: 99 mg/dL (ref 65–99)
POTASSIUM: 4.3 mmol/L (ref 3.5–5.2)
SODIUM: 141 mmol/L (ref 134–144)

## 2016-06-29 LAB — LIPID PANEL
CHOL/HDL RATIO: 4.6 ratio (ref 0.0–5.0)
Cholesterol, Total: 180 mg/dL (ref 100–199)
HDL: 39 mg/dL — ABNORMAL LOW (ref >39)
LDL CALC: 110 mg/dL — AB (ref 0–99)
Triglycerides: 156 mg/dL — ABNORMAL HIGH (ref 0–149)
VLDL Cholesterol Cal: 31 mg/dL (ref 5–40)

## 2016-06-29 LAB — TSH: TSH: 0.532 u[IU]/mL (ref 0.450–4.500)

## 2016-06-29 LAB — HEPATITIS C ANTIBODY

## 2016-06-29 LAB — HIV ANTIBODY (ROUTINE TESTING W REFLEX): HIV Screen 4th Generation wRfx: NONREACTIVE

## 2016-06-29 NOTE — Assessment & Plan Note (Signed)
Pt is a current smoker. Given PCV 23 today. HIV and Hep C checked this visit were negative. Hgba1c wnl.

## 2016-06-29 NOTE — Assessment & Plan Note (Addendum)
Pt on pravastatin 80, lipid panel checked this visit reveals LDL of 110. Will discussed starting lipitor or crestor with him at next visit

## 2016-06-29 NOTE — Assessment & Plan Note (Signed)
BP Readings from Last 3 Encounters:  06/28/16 140/97  09/29/15 143/87  04/28/15 156/88    Lab Results  Component Value Date   NA 141 06/28/2016   K 4.3 06/28/2016   CREATININE 1.08 06/28/2016    Assessment: Blood pressure control:  controlled Progress toward BP goal:   at goal Comments: complaint with his medications, denies fatigue.   Plan: Medications:  Stopped coreg and increased metoprolol ER to 200mg  daily Educational resources provided:   Self management tools provided:   Other plans: BMET today wnl. His HR was 97, TSH ordered and was wnl.

## 2016-06-30 NOTE — Progress Notes (Signed)
Internal Medicine Clinic Attending  Case discussed with Dr. Truong at the time of the visit.  We reviewed the resident's history and exam and pertinent patient test results.  I agree with the assessment, diagnosis, and plan of care documented in the resident's note.  

## 2016-09-20 ENCOUNTER — Other Ambulatory Visit: Payer: Self-pay | Admitting: Internal Medicine

## 2016-09-20 DIAGNOSIS — I10 Essential (primary) hypertension: Secondary | ICD-10-CM

## 2016-09-25 ENCOUNTER — Other Ambulatory Visit: Payer: Self-pay | Admitting: Internal Medicine

## 2016-09-28 ENCOUNTER — Other Ambulatory Visit: Payer: Self-pay | Admitting: *Deleted

## 2016-09-28 NOTE — Telephone Encounter (Signed)
Requesting 90 day supply. Thanks 

## 2016-09-29 MED ORDER — IPRATROPIUM BROMIDE HFA 17 MCG/ACT IN AERS: INHALATION_SPRAY | RESPIRATORY_TRACT | 6 refills | Status: DC

## 2016-10-11 ENCOUNTER — Other Ambulatory Visit: Payer: Self-pay | Admitting: Internal Medicine

## 2016-11-12 ENCOUNTER — Other Ambulatory Visit: Payer: Self-pay | Admitting: Internal Medicine

## 2016-11-13 NOTE — Telephone Encounter (Signed)
appt 01/03/2017 w/pcp

## 2016-11-13 NOTE — Telephone Encounter (Signed)
Refilled on 10/18, #90 with 3 refills.

## 2016-11-19 ENCOUNTER — Other Ambulatory Visit: Payer: Self-pay | Admitting: Internal Medicine

## 2016-11-24 ENCOUNTER — Other Ambulatory Visit: Payer: Self-pay | Admitting: Internal Medicine

## 2016-12-06 ENCOUNTER — Other Ambulatory Visit: Payer: Self-pay | Admitting: Internal Medicine

## 2016-12-06 DIAGNOSIS — I10 Essential (primary) hypertension: Secondary | ICD-10-CM

## 2017-01-03 ENCOUNTER — Ambulatory Visit (INDEPENDENT_AMBULATORY_CARE_PROVIDER_SITE_OTHER): Payer: Medicare Other | Admitting: Internal Medicine

## 2017-01-03 DIAGNOSIS — Z23 Encounter for immunization: Secondary | ICD-10-CM

## 2017-01-03 DIAGNOSIS — I10 Essential (primary) hypertension: Secondary | ICD-10-CM | POA: Diagnosis not present

## 2017-01-03 DIAGNOSIS — Z79899 Other long term (current) drug therapy: Secondary | ICD-10-CM

## 2017-01-03 DIAGNOSIS — E785 Hyperlipidemia, unspecified: Secondary | ICD-10-CM | POA: Diagnosis not present

## 2017-01-03 DIAGNOSIS — F17210 Nicotine dependence, cigarettes, uncomplicated: Secondary | ICD-10-CM

## 2017-01-03 MED ORDER — ROSUVASTATIN CALCIUM 20 MG PO TABS: 20.0000 mg | ORAL_TABLET | Freq: Every day | ORAL | 11 refills | Status: DC

## 2017-01-03 NOTE — Assessment & Plan Note (Signed)
Given flu shot and stool cards today.

## 2017-01-03 NOTE — Assessment & Plan Note (Addendum)
Assessment: Patient on Norvasc 10 mg, metoprolol 200 mg, Dyazide 37. 5-25 milligrams. His blood pressure today is 156/95, with goal of less than 150/90. Patient has gained weight since his last visit today he is 242 pounds up from 239 previously. On recheck, SBP was 127.   Plan: Continue current medications. Discussed weight loss, diet, exercise. Follow-up in 3 months for blood pressure check.

## 2017-01-03 NOTE — Patient Instructions (Signed)
Start taking Crestor 20 mg daily for cholesterol. Stopped taking pravastatin 80 mg.

## 2017-01-03 NOTE — Assessment & Plan Note (Addendum)
Assessment: Patient reportedly quit smoking 2016 although admits to smoking 2-3 cigarettes per day however not everyday. He is motivated to quit. He is going to pick up Nicorette gum after this visit.  Plan: Follow-up in 3 months to reassess tobacco cessation.

## 2017-01-03 NOTE — Assessment & Plan Note (Addendum)
Assessment: Patient is on pravastatin 80 mg daily. His last LDL checked in July 2017 was 110. His ASCVD risk is 20.5%.  Plan: Will start patient on Crestor 20 mg daily.

## 2017-01-03 NOTE — Progress Notes (Signed)
   CC: hypertension  HPI:  Mr.Rivan D Tiburcio PeaHarris is a 61 y.o. with past medical history as outlined below who presents to clinic for hypertension follow-up. Please see problem list for further details.  Past Medical History:  Diagnosis Date  . COPD (chronic obstructive pulmonary disease) (HCC) 10/01/2006  . Drug-induced hepatic toxicity 06/21/2007  . Eczema 10/01/2006  . Hyperlipidemia 10/01/2006  . Hypertension 10/01/2006  . Learning disability 01/07/2007   Determined in court for disability  . Sleep apnea 10/01/2006   mild to moderate  . Tobacco abuse 01/07/2007    Review of Systems: Denies morning fatigue, having to take daytime naps, shortness of breath, chest pain.  Physical Exam:  Vitals:   01/03/17 1315  BP: (!) 156/95  Pulse: (!) 108  Temp: 98.1 F (36.7 C)  TempSrc: Oral  SpO2: 98%  Weight: 242 lb (109.8 kg)  Physical Exam  Constitutional:  appears well-developed and well-nourished. No distress.  HENT:  Head: Normocephalic and atraumatic.  Nose: Nose normal.  Cardiovascular: Tachycardic, regular rhythm and normal heart sounds.  Exam reveals no gallop and no friction rub.   No murmur heard. Pulmonary/Chest: Effort normal and breath sounds normal. No respiratory distress.  has no wheezes.no rales.  Abdominal: Soft. Bowel sounds are normal.  exhibits no distension. There is no tenderness. There is no rebound and no guarding.  Neurological: alert and oriented to person, place, and time.  Skin: Skin is warm and dry. No rash noted.  not diaphoretic. No erythema. No pallor.   Assessment & Plan:   See Encounters Tab for problem based charting.  Patient discussed with Dr. Cleda DaubE. Hoffman

## 2017-01-04 NOTE — Progress Notes (Signed)
Internal Medicine Clinic Attending  Case discussed with Dr. Truong at the time of the visit.  We reviewed the resident's history and exam and pertinent patient test results.  I agree with the assessment, diagnosis, and plan of care documented in the resident's note.  

## 2017-01-21 ENCOUNTER — Other Ambulatory Visit: Payer: Self-pay | Admitting: Internal Medicine

## 2017-01-22 NOTE — Telephone Encounter (Signed)
Please confirm which statin patient is suppose to be taking.  Pravastatin d/c's during 01/03/17 OV-plan was to start crestor 20mg  daily.  Please review and send in new rx if appropriate.Kingsley SpittleGoldston, Darlene Cassady1/29/20182:30 PM

## 2017-02-19 ENCOUNTER — Other Ambulatory Visit: Payer: Self-pay | Admitting: *Deleted

## 2017-02-20 MED ORDER — AMLODIPINE BESYLATE 10 MG PO TABS: 10.0000 mg | ORAL_TABLET | Freq: Every day | ORAL | 11 refills | Status: DC

## 2017-04-09 ENCOUNTER — Other Ambulatory Visit: Payer: Self-pay | Admitting: Pharmacist

## 2017-04-09 DIAGNOSIS — E785 Hyperlipidemia, unspecified: Secondary | ICD-10-CM

## 2017-04-09 MED ORDER — PRAVASTATIN SODIUM 80 MG PO TABS: ORAL_TABLET | ORAL | 3 refills | Status: DC

## 2017-04-09 MED ORDER — ROSUVASTATIN CALCIUM 20 MG PO TABS: 20.0000 mg | ORAL_TABLET | Freq: Every day | ORAL | 3 refills | Status: DC

## 2017-04-09 NOTE — Addendum Note (Signed)
Addended by: Mliss Fritz on: 04/09/2017 03:53 PM   Modules accepted: Orders

## 2017-05-21 ENCOUNTER — Other Ambulatory Visit: Payer: Self-pay | Admitting: Internal Medicine

## 2017-05-22 ENCOUNTER — Other Ambulatory Visit: Payer: Self-pay | Admitting: Internal Medicine

## 2017-05-25 ENCOUNTER — Other Ambulatory Visit: Payer: Self-pay | Admitting: Internal Medicine

## 2017-05-25 NOTE — Telephone Encounter (Signed)
Message sent to front office to schedule pt an appt. 

## 2017-05-25 NOTE — Telephone Encounter (Signed)
Overdue for appt. Pls sch next 30 days HTN F/U

## 2017-06-05 ENCOUNTER — Encounter: Payer: Self-pay | Admitting: *Deleted

## 2017-06-18 ENCOUNTER — Ambulatory Visit (INDEPENDENT_AMBULATORY_CARE_PROVIDER_SITE_OTHER): Payer: Medicare Other | Admitting: Internal Medicine

## 2017-06-18 VITALS — BP 156/95 | HR 98 | Temp 97.1°F | Ht 70.0 in | Wt 240.7 lb

## 2017-06-18 DIAGNOSIS — F17210 Nicotine dependence, cigarettes, uncomplicated: Secondary | ICD-10-CM | POA: Diagnosis not present

## 2017-06-18 DIAGNOSIS — Z79899 Other long term (current) drug therapy: Secondary | ICD-10-CM

## 2017-06-18 DIAGNOSIS — I10 Essential (primary) hypertension: Secondary | ICD-10-CM | POA: Diagnosis not present

## 2017-06-18 MED ORDER — TRIAMTERENE-HCTZ 50-25 MG PO CAPS: 1.0000 | ORAL_CAPSULE | ORAL | 0 refills | Status: DC

## 2017-06-18 NOTE — Patient Instructions (Signed)
[name] it was nice meeting you today.  -Dose of Triamterene-hydrochlorothiazide has been increased to 50-25 mg daily   -Continue taking Amlodipine, Metoprolol, and Losartan as before  -Return for a follow-up in 4 weeks

## 2017-06-19 LAB — BMP8+ANION GAP
Anion Gap: 17 mmol/L (ref 10.0–18.0)
BUN/Creatinine Ratio: 13 (ref 10–24)
BUN: 12 mg/dL (ref 8–27)
CALCIUM: 9.6 mg/dL (ref 8.6–10.2)
CO2: 21 mmol/L (ref 20–29)
Chloride: 101 mmol/L (ref 96–106)
Creatinine, Ser: 0.96 mg/dL (ref 0.76–1.27)
GFR calc non Af Amer: 85 mL/min/{1.73_m2} (ref >59)
GFR, EST AFRICAN AMERICAN: 98 mL/min/{1.73_m2} (ref >59)
Glucose: 93 mg/dL (ref 65–99)
Potassium: 3.8 mmol/L (ref 3.5–5.2)
Sodium: 139 mmol/L (ref 134–144)

## 2017-06-19 NOTE — Assessment & Plan Note (Addendum)
BP Readings from Last 3 Encounters:  06/18/17 (!) 156/95  01/03/17 (!) 156/95  06/28/16 (!) 140/97    Lab Results  Component Value Date   NA 139 06/18/2017   K 3.8 06/18/2017   CREATININE 0.96 06/18/2017    Assessment: Blood pressure control:  above goal Comments: Patient reports compliance with his current medication regimen which includes amlodipine 10 mg daily, triamterene-hydrochlorothiazide 37.5-25 mg daily, metoprolol XL 200 mg daily, and losartan 100 mg daily. He reports feeling well and states he has been walking every day.  Plan: Medications:  Increase dose of triamterene-hydrochlorothiazide to 50-25 mg daily. Continue amlodipine, metoprolol XL, and losartan as above. Educational resources provided:   Educated patient about healthy eating and exercise. Emphasized the importance of weight loss.  Other plans:  -BMP to check renal function and electrolyte status -Return to clinic in 4 weeks  Addendum 06/19/2017: Cr 0.9 and normal electrolytes.

## 2017-06-19 NOTE — Progress Notes (Signed)
Internal Medicine Clinic Attending  Case discussed with Dr. Rathoreat the time of the visit. We reviewed the resident's history and exam and pertinent patient test results. I agree with the assessment, diagnosis, and plan of care documented in the resident's note.  

## 2017-06-19 NOTE — Progress Notes (Signed)
   CC: Patient is here for a follow-up of his hypertension.  HPI:  Christopher Thompson is a 61 y.o. male with a past medical history of conditions listed below presenting to the clinic for a follow-up of his hypertension. Please see problem based charting for the status of the patient's current and chronic medical conditions.   Past Medical History:  Diagnosis Date  . COPD (chronic obstructive pulmonary disease) (HCC) 10/01/2006  . Drug-induced hepatic toxicity 06/21/2007  . Eczema 10/01/2006  . Hyperlipidemia 10/01/2006  . Hypertension 10/01/2006  . Learning disability 01/07/2007   Determined in court for disability  . Sleep apnea 10/01/2006   mild to moderate  . Tobacco abuse 01/07/2007    Review of Systems: Pertinent positives mentioned in HPI. Remainder of all ROS negative.   Physical Exam:  Vitals:   06/18/17 1313  BP: (!) 156/95  Pulse: 98  Temp: 97.1 F (36.2 C)  TempSrc: Oral  SpO2: 100%  Weight: 240 lb 11.2 oz (109.2 kg)  Height: 5\' 10"  (1.778 m)   Physical Exam  Constitutional: He is oriented to person, place, and time. He appears well-developed and well-nourished. No distress.  HENT:  Head: Normocephalic and atraumatic.  Eyes: Right eye exhibits no discharge. Left eye exhibits no discharge.  Cardiovascular: Normal rate, regular rhythm and intact distal pulses.   Pulmonary/Chest: Effort normal and breath sounds normal. No respiratory distress. He has no wheezes. He has no rales.  Abdominal: Soft. Bowel sounds are normal. He exhibits no distension. There is no tenderness.  Musculoskeletal: He exhibits no edema.  Neurological: He is alert and oriented to person, place, and time.  Skin: Skin is warm and dry.    Assessment & Plan:   See Encounters Tab for problem based charting.  Patient discussed with Dr. Criselda PeachesMullen

## 2017-06-26 ENCOUNTER — Telehealth: Payer: Self-pay | Admitting: *Deleted

## 2017-06-26 MED ORDER — TRIAMTERENE 50 MG PO CAPS: 50.0000 mg | ORAL_CAPSULE | Freq: Every day | ORAL | 0 refills | Status: DC

## 2017-06-26 MED ORDER — HYDROCHLOROTHIAZIDE 25 MG PO TABS: 25.0000 mg | ORAL_TABLET | Freq: Every day | ORAL | 0 refills | Status: DC

## 2017-06-26 NOTE — Telephone Encounter (Signed)
Spoke to the patient's pharmacy - stated insurance will pay if the medications are prescribed separately instead of in a combo pill form. Asked them to cancel the combo pill prescription and 2 new prescriptions have been sent in.

## 2017-06-26 NOTE — Telephone Encounter (Signed)
Fax from [facility] - Triamterene/HCTZ is not covered by pt's insurance. Please send alternative rx Thanks

## 2017-06-26 NOTE — Addendum Note (Signed)
Addended by: Snyder GiovanniATHORE, Brayden Betters on: 06/26/2017 06:07 PM   Modules accepted: Orders

## 2017-07-04 NOTE — Telephone Encounter (Signed)
Fax from pharmacy: Dyrenium not covered by insurance, alternative requested. Pharmacy cannot give any alternative that is covered, stated will have to call insurance.

## 2017-07-06 NOTE — Telephone Encounter (Signed)
Will speak to Dr. Selena BattenKim. Thanks.

## 2017-07-19 ENCOUNTER — Other Ambulatory Visit: Payer: Self-pay | Admitting: Pharmacist

## 2017-07-19 DIAGNOSIS — I10 Essential (primary) hypertension: Secondary | ICD-10-CM

## 2017-07-19 MED ORDER — TRIAMTERENE-HCTZ 37.5-25 MG PO TABS: 1.0000 | ORAL_TABLET | Freq: Every day | ORAL | 0 refills | Status: DC

## 2017-09-06 ENCOUNTER — Ambulatory Visit (INDEPENDENT_AMBULATORY_CARE_PROVIDER_SITE_OTHER): Payer: Medicare Other | Admitting: Internal Medicine

## 2017-09-06 ENCOUNTER — Encounter: Payer: Self-pay | Admitting: Internal Medicine

## 2017-09-06 DIAGNOSIS — Z0000 Encounter for general adult medical examination without abnormal findings: Secondary | ICD-10-CM

## 2017-09-06 DIAGNOSIS — F17200 Nicotine dependence, unspecified, uncomplicated: Secondary | ICD-10-CM

## 2017-09-06 DIAGNOSIS — Z79899 Other long term (current) drug therapy: Secondary | ICD-10-CM

## 2017-09-06 DIAGNOSIS — J449 Chronic obstructive pulmonary disease, unspecified: Secondary | ICD-10-CM | POA: Diagnosis not present

## 2017-09-06 DIAGNOSIS — F17210 Nicotine dependence, cigarettes, uncomplicated: Secondary | ICD-10-CM | POA: Diagnosis not present

## 2017-09-06 DIAGNOSIS — G4730 Sleep apnea, unspecified: Secondary | ICD-10-CM | POA: Diagnosis not present

## 2017-09-06 DIAGNOSIS — J439 Emphysema, unspecified: Secondary | ICD-10-CM

## 2017-09-06 DIAGNOSIS — I10 Essential (primary) hypertension: Secondary | ICD-10-CM

## 2017-09-06 DIAGNOSIS — Z23 Encounter for immunization: Secondary | ICD-10-CM

## 2017-09-06 NOTE — Assessment & Plan Note (Signed)
Patient received flu vaccination today 

## 2017-09-06 NOTE — Progress Notes (Signed)
   CC: hypertension  HPI:  [name] is a 61 y.o. with past medical history documented as below presenting for hypertension follow up and flu vaccination. He has no acute complaints.   Past Medical History:  Diagnosis Date  . COPD (chronic obstructive pulmonary disease) (HCC) 10/01/2006  . Drug-induced hepatic toxicity 06/21/2007  . Eczema 10/01/2006  . Hyperlipidemia 10/01/2006  . Hypertension 10/01/2006  . Learning disability 01/07/2007   Determined in court for disability  . Sleep apnea 10/01/2006   mild to moderate  . Tobacco abuse 01/07/2007   Review of Systems:   Review of Systems  Constitutional: Negative for chills and fever.  HENT: Negative.   Eyes: Negative.   Respiratory: Negative for cough, shortness of breath and wheezing.   Cardiovascular: Negative for chest pain.  Gastrointestinal: Negative for abdominal pain, constipation, diarrhea, heartburn, nausea and vomiting.  Genitourinary: Negative.   Musculoskeletal: Negative for joint pain and myalgias.  Skin: Negative.   Neurological: Negative.   Psychiatric/Behavioral: Negative for depression.    Physical Exam:  Vitals:   09/06/17 1341 09/06/17 1430  BP: (!) 143/90 137/83  Pulse: 100 95  Temp: 98.1 F (36.7 C)   TempSrc: Oral   SpO2: 99%   Weight: 238 lb 9.6 oz (108.2 kg)   Height: 5' 10.5" (1.791 m)    General: sitting comfortably in chair, NAD HEENT: [place]/AT, EOMI, no scleral icterus, PERRL Cardiac: RRR, No R/M/G appreciated Pulm: normal effort, CTAB Abd: soft, non tender, non distended, BS normal Ext: extremities well perfused, no peripheral edema Neuro: alert and oriented X3, cranial nerves II-XII grossly intact   Assessment & Plan:   See Encounters Tab for problem based charting.  Patient seen with Dr. Rogelia BogaButcher

## 2017-09-06 NOTE — Assessment & Plan Note (Signed)
Patient denies SOB, cough, or wheezing. He does not use his Advair daily only when needed. He also states he does not use his albuterol inhaler often. Patient is a current smoker, and smokes about 2-3 cigarettes daily.  Assessment:  COPD/emphysema, minimal obstructive disease. Seems well controlled as the patient is asymptomatic without taking his maintenance inhaler.   Plan: -Discussed with patient that the Advair is a maintenance inhaler and he should take two puffs twice daily

## 2017-09-06 NOTE — Assessment & Plan Note (Signed)
Patient states he was diagnoses with sleep apnea many years ago and completed a sleep study at [facility], but cannot remember when. It is not the EHR. He does not wear a CPAP machine home. It is possible that his OSA is contributing to his hypertension. Patient is not interested in repeating a sleep study at this time.   Plan: -[facility]bt[facility] -Will most likely need a repeat sleep study and CPAP titration

## 2017-09-06 NOTE — Assessment & Plan Note (Signed)
Blood Pressure 09/06/2017 06/18/2017 01/03/2017  BP 137/83 156/95 156/95  Blood pressure well controlled and improved since last visit. BP at goal <140/80. Current regimen includes: Amlodipine 10 mg, HCTZ 25 mg, Losartan 100 mg, Toprol XL 200 MG. BMET in 05/2017 was within normal limits.   Plan: -Continue current regimen  -If not controlled on current regimen consider adding Aldactone

## 2017-09-06 NOTE — Patient Instructions (Addendum)
[name],   It was a pleasure meeting you today!  Please continue taking your current blood pressure medications. We have made no changes.   You can take two puffs twice daily of the Advair to prevent shortness of breath and wheezing. This medicine is meant to be taken daily.   Please follow up in 6 months!

## 2017-09-06 NOTE — Assessment & Plan Note (Signed)
Patient is motivated to quit smoking. He is currently smoking 2-3 cigarettes daily and will chew Nicorette gum when needed. He is currently not interested in a nicotine patch. He wants to try and quit by myself over the next several months and assess at the next visit if he needs the patch.  Plan: -Encouraged the patient to continue reducing the amount of cigarettes he smokes daily  -Plan to reassess his need/want for a nicotine patch at next visit

## 2017-09-10 NOTE — Addendum Note (Signed)
Addended by: Blanch Media A on: 09/10/2017 03:37 PM   Modules accepted: Level of Service

## 2017-09-10 NOTE — Progress Notes (Signed)
Internal Medicine Clinic Attending  I saw and evaluated the patient.  I personally confirmed the key portions of the history and exam documented by Dr. LaCroce and I reviewed pertinent patient test results.  The assessment, diagnosis, and plan were formulated together and I agree with the documentation in the resident's note.  

## 2017-09-21 ENCOUNTER — Other Ambulatory Visit: Payer: Self-pay | Admitting: *Deleted

## 2017-09-21 DIAGNOSIS — I10 Essential (primary) hypertension: Secondary | ICD-10-CM

## 2017-09-21 MED ORDER — METOPROLOL SUCCINATE ER 200 MG PO TB24: 200.0000 mg | ORAL_TABLET | Freq: Every day | ORAL | 4 refills | Status: DC

## 2017-09-24 ENCOUNTER — Other Ambulatory Visit: Payer: Self-pay | Admitting: Internal Medicine

## 2017-10-22 ENCOUNTER — Other Ambulatory Visit: Payer: Self-pay | Admitting: Internal Medicine

## 2017-10-22 DIAGNOSIS — I10 Essential (primary) hypertension: Secondary | ICD-10-CM

## 2017-11-20 ENCOUNTER — Other Ambulatory Visit: Payer: Self-pay | Admitting: Internal Medicine

## 2017-11-23 ENCOUNTER — Other Ambulatory Visit: Payer: Self-pay | Admitting: Internal Medicine

## 2017-12-13 ENCOUNTER — Other Ambulatory Visit: Payer: Self-pay | Admitting: *Deleted

## 2017-12-13 MED ORDER — LOSARTAN POTASSIUM 100 MG PO TABS: 100.0000 mg | ORAL_TABLET | Freq: Every day | ORAL | 3 refills | Status: DC

## 2017-12-27 ENCOUNTER — Other Ambulatory Visit: Payer: Self-pay | Admitting: *Deleted

## 2017-12-27 MED ORDER — IPRATROPIUM BROMIDE HFA 17 MCG/ACT IN AERS: INHALATION_SPRAY | RESPIRATORY_TRACT | 3 refills | Status: DC

## 2018-02-20 ENCOUNTER — Other Ambulatory Visit: Payer: Self-pay | Admitting: Internal Medicine

## 2018-02-20 DIAGNOSIS — I10 Essential (primary) hypertension: Secondary | ICD-10-CM

## 2018-03-06 ENCOUNTER — Encounter: Payer: Medicare Other | Admitting: Internal Medicine

## 2018-03-11 ENCOUNTER — Other Ambulatory Visit: Payer: Self-pay | Admitting: Internal Medicine

## 2018-03-13 ENCOUNTER — Encounter: Payer: Self-pay | Admitting: Internal Medicine

## 2018-03-13 ENCOUNTER — Ambulatory Visit (INDEPENDENT_AMBULATORY_CARE_PROVIDER_SITE_OTHER): Payer: Medicare Other | Admitting: Internal Medicine

## 2018-03-13 ENCOUNTER — Encounter (INDEPENDENT_AMBULATORY_CARE_PROVIDER_SITE_OTHER): Payer: Self-pay

## 2018-03-13 ENCOUNTER — Other Ambulatory Visit: Payer: Self-pay

## 2018-03-13 VITALS — BP 139/82 | HR 96 | Temp 97.9°F | Ht 70.5 in | Wt 234.7 lb

## 2018-03-13 DIAGNOSIS — Z79899 Other long term (current) drug therapy: Secondary | ICD-10-CM

## 2018-03-13 DIAGNOSIS — Z0000 Encounter for general adult medical examination without abnormal findings: Secondary | ICD-10-CM

## 2018-03-13 DIAGNOSIS — J439 Emphysema, unspecified: Secondary | ICD-10-CM

## 2018-03-13 DIAGNOSIS — F17210 Nicotine dependence, cigarettes, uncomplicated: Secondary | ICD-10-CM

## 2018-03-13 DIAGNOSIS — F17200 Nicotine dependence, unspecified, uncomplicated: Secondary | ICD-10-CM

## 2018-03-13 DIAGNOSIS — I10 Essential (primary) hypertension: Secondary | ICD-10-CM

## 2018-03-13 DIAGNOSIS — Z7951 Long term (current) use of inhaled steroids: Secondary | ICD-10-CM | POA: Diagnosis not present

## 2018-03-13 NOTE — Assessment & Plan Note (Addendum)
BP Readings from Last 3 Encounters:  03/13/18 139/82  09/06/17 137/83  06/18/17 (!) 156/95   Blood pressure well controlled on current regimen. Current regimen includes Amlodipine 10 mg, HCTZ 25 mg, Losartan 100 mg, Toprol 200 mg daily. Patient has also been walking several miles daily and has lost 4 pounds since his last visit.   Plan: -Continue current regimen -Provided encouragement to continue the lifestyle modifications he has already made as these will continue to help his BP

## 2018-03-13 NOTE — Progress Notes (Signed)
   CC: hypertension  HPI:  Christopher Thompson is a 62 y.o. male with PMH as documented below presenting for his 6 month follow up appointment for hypertension. Please see encounter based charting for a detailed description of the patient's chronic medical problems.   Christopher Thompson is overall doing great. He has been walking 2-3 miles daily and has been making modifications to his diet. He has lost 4 lbs since his last visit. He has no acute complaints.   Past Medical History:  Diagnosis Date  . COPD (chronic obstructive pulmonary disease) (HCC) 10/01/2006  . Drug-induced hepatic toxicity 06/21/2007  . Eczema 10/01/2006  . Hyperlipidemia 10/01/2006  . Hypertension 10/01/2006  . Learning disability 01/07/2007   Determined in court for disability  . Sleep apnea 10/01/2006   mild to moderate  . Tobacco abuse 01/07/2007   Review of Systems:   Review of Systems  Constitutional: Negative for chills and fever.  Respiratory: Negative for cough and shortness of breath.   Cardiovascular: Negative for chest pain and leg swelling.  Gastrointestinal: Negative for abdominal pain, blood in stool, constipation, diarrhea, melena, nausea and vomiting.  Psychiatric/Behavioral: Negative.     Physical Exam:  Vitals:   03/13/18 1313  BP: 139/82  Pulse: 96  Temp: 97.9 F (36.6 C)  TempSrc: Oral  SpO2: 99%  Weight: 234 lb 11.2 oz (106.5 kg)  Height: 5' 10.5" (1.791 m)   Physical Exam  Constitutional: He is oriented to person, place, and time. He appears well-developed and well-nourished. No distress.  HENT:  Head: Normocephalic and atraumatic.  Eyes: Conjunctivae are normal. No scleral icterus.  Neck: Normal range of motion. Neck supple.  Cardiovascular: Normal rate, regular rhythm and normal heart sounds.  Pulmonary/Chest: Effort normal and breath sounds normal.  Musculoskeletal: Normal range of motion. He exhibits no edema.  Neurological: He is alert and oriented to person, place, and time.    Skin: Skin is warm and dry.  Psychiatric: He has a normal mood and affect.    Assessment & Plan:   See Encounters Tab for problem based charting.  Patient discussed with Dr. Rogelia BogaButcher

## 2018-03-13 NOTE — Patient Instructions (Addendum)
Mr. Tiburcio PeaHarris,   It was a pleasure seeing you today.   I have made no changes to your medication regimen.   Keep up the great work with exercising and walking daily! Since your last visit you have lost 4 pounds!   For the nicotine patch, place one patch on clean dry skin every 24 hours. Remember to change the patch every 24 hours.   I have given you the 14 mg dose patch, which I think is adequate for you. Continue the 14 mg patch for a total of 6 weeks. After 6 weeks reduce your dose to 7 mg daily. You can pick this up at the local drug store.   Wear the 7 mg patch for an additional 6 weeks then stop. At that point you should be able to stop smoking cigarettes completely.

## 2018-03-14 ENCOUNTER — Encounter: Payer: Self-pay | Admitting: Internal Medicine

## 2018-03-14 NOTE — Assessment & Plan Note (Addendum)
Patient is due for colonoscopy, but prefers to do stool cards at this time.   Plan: -Provided stool cards for patient   ADDENDUM 05/27/2018 FOBT positive. Called patient and recommended I refer him to GI and he have a screening colonoscopy. I explained that the FOBT is a screening test for colon cancer. It does not mean he has colon cancer, but warrants further testing with a coloscopy. He understood this and states he will call the clinic in 1 week to discuss when he would like me to place a GI referral.

## 2018-03-14 NOTE — Assessment & Plan Note (Signed)
Denies SOB, wheezing, or cough. He is able to exercise daily without any DOE. He is well controlled on his current inhaler regimen, which includes Advair, Albuterol PRN, and Atrovent.   Plan: -continue current regimen

## 2018-03-14 NOTE — Assessment & Plan Note (Signed)
Patient is currently trying to quit smoking. He is down to 1-3 cigarettes daily and has been using nicotine gum, which helps with cravings.   Plan: -Encouraged patient to continue using nicotine gum

## 2018-03-15 NOTE — Progress Notes (Signed)
Internal Medicine Clinic Attending  Case discussed with Dr. LaCroce at the time of the visit.  We reviewed the resident's history and exam and pertinent patient test results.  I agree with the assessment, diagnosis, and plan of care documented in the resident's note.  

## 2018-04-09 ENCOUNTER — Other Ambulatory Visit: Payer: Self-pay | Admitting: Internal Medicine

## 2018-04-09 DIAGNOSIS — I10 Essential (primary) hypertension: Secondary | ICD-10-CM

## 2018-04-21 ENCOUNTER — Other Ambulatory Visit: Payer: Self-pay | Admitting: Internal Medicine

## 2018-05-02 ENCOUNTER — Telehealth: Payer: Self-pay | Admitting: Internal Medicine

## 2018-05-09 DIAGNOSIS — Z0000 Encounter for general adult medical examination without abnormal findings: Secondary | ICD-10-CM | POA: Diagnosis not present

## 2018-05-22 ENCOUNTER — Other Ambulatory Visit: Payer: Medicare Other

## 2018-05-22 DIAGNOSIS — Z0000 Encounter for general adult medical examination without abnormal findings: Secondary | ICD-10-CM

## 2018-05-25 LAB — FECAL OCCULT BLOOD, IMMUNOCHEMICAL: FECAL OCCULT BLD: POSITIVE — AB

## 2018-05-27 ENCOUNTER — Telehealth: Payer: Self-pay | Admitting: Internal Medicine

## 2018-05-27 NOTE — Telephone Encounter (Signed)
  Reason for call:   I placed an outgoing call to [name] at 8:45 AM regarding positive fecal occult stool test. I explained to the patient that he should have a screening colonoscopy.    Assessment/ Plan:   Recommended a gastroenterology referral and screening colonoscopy  The patient said he did not want to have a colonoscopy at this time   He did not know when he would want to have this done  Patient states he would call the clinic next week to let me know when he would like me to place the GI referral  Will follow up    Toney RakesLacroce, Darrelle Wiberg J, MD   05/27/2018, 8:45 AM

## 2018-06-03 ENCOUNTER — Other Ambulatory Visit: Payer: Self-pay | Admitting: Internal Medicine

## 2018-06-27 ENCOUNTER — Other Ambulatory Visit: Payer: Self-pay | Admitting: Internal Medicine

## 2018-06-28 ENCOUNTER — Other Ambulatory Visit: Payer: Self-pay | Admitting: Internal Medicine

## 2018-06-28 MED ORDER — IPRATROPIUM BROMIDE HFA 17 MCG/ACT IN AERS
INHALATION_SPRAY | RESPIRATORY_TRACT | 3 refills | Status: DC
Start: 2018-06-28 — End: 2018-10-08

## 2018-06-28 MED ORDER — AMLODIPINE BESYLATE 10 MG PO TABS: 10.0000 mg | ORAL_TABLET | Freq: Every day | ORAL | 3 refills | Status: DC

## 2018-06-28 NOTE — Telephone Encounter (Signed)
Will need visit scheduled with new PCP in the next few months

## 2018-06-28 NOTE — Telephone Encounter (Signed)
Needs refills on Atrovent 17mg /ACT inhaler, amlopdipine 10mg   @ [facility] [address]- asked pharmacy didn't received for these two- pt contact #[phone]

## 2018-07-09 ENCOUNTER — Other Ambulatory Visit: Payer: Self-pay | Admitting: *Deleted

## 2018-07-09 MED ORDER — FLUTICASONE-SALMETEROL 250-50 MCG/DOSE IN AEPB: 2.0000 | INHALATION_SPRAY | Freq: Two times a day (BID) | RESPIRATORY_TRACT | 2 refills | Status: DC

## 2018-07-15 ENCOUNTER — Encounter: Payer: Self-pay | Admitting: *Deleted

## 2018-08-12 ENCOUNTER — Encounter: Payer: Self-pay | Admitting: *Deleted

## 2018-08-29 ENCOUNTER — Encounter: Payer: Medicare Other | Admitting: Internal Medicine

## 2018-09-02 ENCOUNTER — Other Ambulatory Visit: Payer: Self-pay | Admitting: Internal Medicine

## 2018-10-08 ENCOUNTER — Ambulatory Visit (INDEPENDENT_AMBULATORY_CARE_PROVIDER_SITE_OTHER): Payer: Medicare Other | Admitting: Internal Medicine

## 2018-10-08 ENCOUNTER — Other Ambulatory Visit: Payer: Self-pay

## 2018-10-08 ENCOUNTER — Encounter: Payer: Self-pay | Admitting: Internal Medicine

## 2018-10-08 VITALS — BP 136/84 | HR 93 | Temp 98.1°F | Ht 70.5 in | Wt 234.7 lb

## 2018-10-08 DIAGNOSIS — R195 Other fecal abnormalities: Secondary | ICD-10-CM | POA: Diagnosis not present

## 2018-10-08 DIAGNOSIS — J439 Emphysema, unspecified: Secondary | ICD-10-CM

## 2018-10-08 DIAGNOSIS — E785 Hyperlipidemia, unspecified: Secondary | ICD-10-CM | POA: Diagnosis not present

## 2018-10-08 DIAGNOSIS — I10 Essential (primary) hypertension: Secondary | ICD-10-CM

## 2018-10-08 DIAGNOSIS — F17200 Nicotine dependence, unspecified, uncomplicated: Secondary | ICD-10-CM

## 2018-10-08 DIAGNOSIS — Z79899 Other long term (current) drug therapy: Secondary | ICD-10-CM

## 2018-10-08 DIAGNOSIS — Z720 Tobacco use: Secondary | ICD-10-CM

## 2018-10-08 DIAGNOSIS — Z23 Encounter for immunization: Secondary | ICD-10-CM

## 2018-10-08 MED ORDER — ALBUTEROL SULFATE HFA 108 (90 BASE) MCG/ACT IN AERS: 2.0000 | INHALATION_SPRAY | Freq: Four times a day (QID) | RESPIRATORY_TRACT | 5 refills | Status: DC | PRN

## 2018-10-08 MED ORDER — HYDROCHLOROTHIAZIDE 25 MG PO TABS: 25.0000 mg | ORAL_TABLET | Freq: Every day | ORAL | 2 refills | Status: DC

## 2018-10-08 MED ORDER — LOSARTAN POTASSIUM 100 MG PO TABS: 100.0000 mg | ORAL_TABLET | Freq: Every day | ORAL | 3 refills | Status: DC

## 2018-10-08 MED ORDER — METOPROLOL SUCCINATE ER 200 MG PO TB24: 200.0000 mg | ORAL_TABLET | Freq: Every day | ORAL | 3 refills | Status: DC

## 2018-10-08 MED ORDER — FLUTICASONE-SALMETEROL 250-50 MCG/DOSE IN AEPB: 2.0000 | INHALATION_SPRAY | Freq: Two times a day (BID) | RESPIRATORY_TRACT | 2 refills | Status: DC

## 2018-10-08 MED ORDER — ROSUVASTATIN CALCIUM 20 MG PO TABS: 20.0000 mg | ORAL_TABLET | Freq: Every day | ORAL | 5 refills | Status: DC

## 2018-10-08 MED ORDER — IPRATROPIUM BROMIDE HFA 17 MCG/ACT IN AERS: INHALATION_SPRAY | RESPIRATORY_TRACT | 3 refills | Status: DC

## 2018-10-08 NOTE — Assessment & Plan Note (Signed)
States he does not always use his inhalers everyday but when he feels SOB they are helpful. He denies SOB, wheezing or cough. He says his symptoms sometimes are worse in the cold. We discussed potentially decreasing his advair as he may only need treatment for mild symptoms but will hold off until after the winter season.   - cont. Advair, ventolin, and atrovent - consider decreasing advair dosage at follow-up

## 2018-10-08 NOTE — Patient Instructions (Addendum)
Thank you for allowing [place] to provide your care today. Today we discussed your asthma, hypertension and colonoscopy screening.   I have ordered a basic metabolic panel and complete blood count. I will call if any are abnormal.    Today we restarted your Crestor (rosuvastatin) 20 mg. Please take one pill per day.    Please follow-up in three months, and we will discuss following up for a colonoscopy.    Should you have any questions or concerns please call the internal medicine clinic at [phone].

## 2018-10-08 NOTE — Assessment & Plan Note (Signed)
He continues to smoke 3-4 cigarettes per day. He is not interested in restarting the patch at this time but has some resources that he plans to use to continue trying to cut back and quit.

## 2018-10-08 NOTE — Assessment & Plan Note (Signed)
Received flu shot today. 

## 2018-10-08 NOTE — Assessment & Plan Note (Addendum)
Due for colonoscopy April 2019 and given screening FOBT as he did not want colonoscopy. FOBT was positive, but he did not want colonoscopy at that time. Today, he still does not want a colonoscopy but says he will consider it after the first of the year. He has friends who had colonoscopies, required surgery, and passed away shortly thereafter, which makes him very nervous. He had a previous colonoscopy around age 62 he believes but cannot remember the results. He states he has had blood in his stool once but very little. He has BM 2-3 times per day, denies diarrhea, constipation and thinning of his stool. He denies nausea or vomiting or weight loss.   - CBC today  - follow-up in 3 months to discuss scheduling colonoscopy

## 2018-10-08 NOTE — Assessment & Plan Note (Signed)
BP Readings from Last 3 Encounters:  10/08/18 136/84  03/13/18 139/82  09/06/17 137/83   BP controlled on current medications.   - cont. Amlodipine 10 mg qd, HCTZ 25 mg qd, losartan 100 mg qd, Toprol 200 mg qd  - BMP

## 2018-10-08 NOTE — Assessment & Plan Note (Signed)
Has not been on Crestor the past couple of months.   - refilled medication

## 2018-10-08 NOTE — Progress Notes (Signed)
   CC: check up, medication refill   HPI:  [name] is a 62 y.o. with PMH as below presenting today for check up. He states he has no concerns today and has been feeling good as he walks 2-3 miles per day.    Please see A&P for assessment of the patient's chronic medical conditions.   Past Medical History:  Diagnosis Date  . COPD (chronic obstructive pulmonary disease) (HCC) 10/01/2006  . Drug-induced hepatic toxicity 06/21/2007  . Eczema 10/01/2006  . Hyperlipidemia 10/01/2006  . Hypertension 10/01/2006  . Learning disability 01/07/2007   Determined in court for disability  . Sleep apnea 10/01/2006   mild to moderate  . Tobacco abuse 01/07/2007   Review of Systems:    Review of Systems  Constitutional: Negative for diaphoresis, fever and weight loss.  Eyes: Negative for pain, discharge and redness.  Respiratory: Negative for cough, hemoptysis, shortness of breath and wheezing.   Cardiovascular: Negative for chest pain, palpitations, orthopnea and leg swelling.  Gastrointestinal: Positive for blood in stool. Negative for constipation, diarrhea, heartburn, melena, nausea and vomiting.  Musculoskeletal: Negative for back pain, myalgias and neck pain.  Neurological: Negative for dizziness, sensory change, weakness and headaches.   Physical Exam:  Constitution: NAD, pleasant but slightly anxious Eyes: no scleral icterus, EOM intact Cardio: RRR, no m/r/g Respiratory: CTAB, no w/r/r, no pitting edema  Abdominal: soft, non-distended, NTTP MSK: no pitting edema, strength symmetrical Neuro: A&O, cooperative Skin: c/d/i   Vitals:   10/08/18 1405  BP: 136/84  Pulse: 93  Temp: 98.1 F (36.7 C)  TempSrc: Oral  SpO2: 97%  Weight: 234 lb 11.2 oz (106.5 kg)  Height: 5' 10.5" (1.791 m)    Assessment & Plan:   See Encounters Tab for problem based charting.  Patient seen with Dr. Josem Kaufmann

## 2018-10-09 LAB — CBC
HEMATOCRIT: 43 % (ref 37.5–51.0)
Hemoglobin: 15.3 g/dL (ref 13.0–17.7)
MCH: 31.5 pg (ref 26.6–33.0)
MCHC: 35.6 g/dL (ref 31.5–35.7)
MCV: 89 fL (ref 79–97)
Platelets: 201 10*3/uL (ref 150–450)
RBC: 4.85 x10E6/uL (ref 4.14–5.80)
RDW: 15.5 % — ABNORMAL HIGH (ref 12.3–15.4)
WBC: 7.9 10*3/uL (ref 3.4–10.8)

## 2018-10-09 LAB — BMP8+ANION GAP
ANION GAP: 15 mmol/L (ref 10.0–18.0)
BUN/Creatinine Ratio: 14 (ref 10–24)
BUN: 12 mg/dL (ref 8–27)
CALCIUM: 9.5 mg/dL (ref 8.6–10.2)
CHLORIDE: 102 mmol/L (ref 96–106)
CO2: 25 mmol/L (ref 20–29)
Creatinine, Ser: 0.88 mg/dL (ref 0.76–1.27)
GFR calc Af Amer: 106 mL/min/{1.73_m2} (ref >59)
GFR calc non Af Amer: 91 mL/min/{1.73_m2} (ref >59)
Glucose: 91 mg/dL (ref 65–99)
POTASSIUM: 3.8 mmol/L (ref 3.5–5.2)
Sodium: 142 mmol/L (ref 134–144)

## 2018-10-10 NOTE — Progress Notes (Signed)
Patient ID: Christopher Thompson, male   DOB: Feb 23, 1956, 62 y.o.   MRN: [id number]  I saw and evaluated the patient.  I personally confirmed the key portions of Dr. Al Decant history and exam and reviewed pertinent patient test results.  The assessment, diagnosis, and plan were formulated together and I agree with the documentation in the resident's note.  The importance of a colonoscopy was stressed given the heme (+) stools.  We will consider de-escalation of his advair dose in the spring.

## 2018-10-10 NOTE — Addendum Note (Signed)
Addended by: Doneen Poisson D on: 10/10/2018 06:43 AM   Modules accepted: Level of Service

## 2018-12-06 ENCOUNTER — Other Ambulatory Visit: Payer: Self-pay | Admitting: Oncology

## 2018-12-06 DIAGNOSIS — J439 Emphysema, unspecified: Secondary | ICD-10-CM

## 2018-12-13 ENCOUNTER — Other Ambulatory Visit: Payer: Self-pay

## 2018-12-13 DIAGNOSIS — J439 Emphysema, unspecified: Secondary | ICD-10-CM

## 2018-12-13 MED ORDER — IPRATROPIUM BROMIDE HFA 17 MCG/ACT IN AERS: INHALATION_SPRAY | RESPIRATORY_TRACT | 3 refills | Status: DC

## 2018-12-13 MED ORDER — ALBUTEROL SULFATE HFA 108 (90 BASE) MCG/ACT IN AERS: 2.0000 | INHALATION_SPRAY | Freq: Four times a day (QID) | RESPIRATORY_TRACT | 5 refills | Status: DC | PRN

## 2018-12-27 ENCOUNTER — Other Ambulatory Visit: Payer: Self-pay | Admitting: Oncology

## 2018-12-27 DIAGNOSIS — J439 Emphysema, unspecified: Secondary | ICD-10-CM

## 2019-04-09 ENCOUNTER — Other Ambulatory Visit: Payer: Self-pay | Admitting: Internal Medicine

## 2019-04-09 DIAGNOSIS — E785 Hyperlipidemia, unspecified: Secondary | ICD-10-CM

## 2019-04-24 ENCOUNTER — Other Ambulatory Visit: Payer: Self-pay | Admitting: Internal Medicine

## 2019-04-24 DIAGNOSIS — J439 Emphysema, unspecified: Secondary | ICD-10-CM

## 2019-05-03 ENCOUNTER — Other Ambulatory Visit: Payer: Self-pay | Admitting: Internal Medicine

## 2019-05-03 DIAGNOSIS — J439 Emphysema, unspecified: Secondary | ICD-10-CM

## 2019-05-05 NOTE — Telephone Encounter (Signed)
Next appt 05-07-19

## 2019-05-07 ENCOUNTER — Other Ambulatory Visit: Payer: Self-pay

## 2019-05-07 ENCOUNTER — Ambulatory Visit (INDEPENDENT_AMBULATORY_CARE_PROVIDER_SITE_OTHER): Payer: Medicare Other | Admitting: Internal Medicine

## 2019-05-07 DIAGNOSIS — J439 Emphysema, unspecified: Secondary | ICD-10-CM | POA: Diagnosis not present

## 2019-05-07 DIAGNOSIS — R195 Other fecal abnormalities: Secondary | ICD-10-CM | POA: Diagnosis not present

## 2019-05-07 DIAGNOSIS — F17210 Nicotine dependence, cigarettes, uncomplicated: Secondary | ICD-10-CM

## 2019-05-07 DIAGNOSIS — F17200 Nicotine dependence, unspecified, uncomplicated: Secondary | ICD-10-CM

## 2019-05-07 DIAGNOSIS — I10 Essential (primary) hypertension: Secondary | ICD-10-CM | POA: Diagnosis not present

## 2019-05-07 MED ORDER — IPRATROPIUM BROMIDE HFA 17 MCG/ACT IN AERS: INHALATION_SPRAY | RESPIRATORY_TRACT | 5 refills | Status: DC

## 2019-05-07 MED ORDER — ALBUTEROL SULFATE HFA 108 (90 BASE) MCG/ACT IN AERS: INHALATION_SPRAY | RESPIRATORY_TRACT | 5 refills | Status: DC

## 2019-05-07 MED ORDER — ADVAIR DISKUS 250-50 MCG/DOSE IN AEPB: INHALATION_SPRAY | RESPIRATORY_TRACT | 2 refills | Status: DC

## 2019-05-07 NOTE — Progress Notes (Signed)
   CC: medication refill  This is a telephone encounter between Christopher Thompson and Christopher Thompson on 05/07/2019 for medication refill. The visit was conducted with the patient located at home and Christopher Thompson at [facility]. The patient's identity was confirmed using their DOB and current address. The patient has consented to being evaluated through a telephone encounter and understands the associated risks (an examination cannot be done and the patient may need to come in for an appointment) / benefits (allows the patient to remain at home, decreasing exposure to coronavirus). I personally spent 23 minutes on medical discussion.   HPI:  [name] is a 63 y.o. with PMH as below presenting via telehealth visit for medication refills. He states he is doing well at home, just trying to stay at home as much as possible. He continues to walk around the apartment with his mask and has been eating more food at home. He wears gloves and mask when he goes anywhere. He has been using his inhalers at home and these help relieve any shortness of breath. He does not require his prn inhalers every day. He denies cough, sore throat, or shortness of breath. He does still smoke 3-4 cigarettes per day. He does need a refill on his inhalers including advair, albuterol and atrovent. He continues to take his blood pressure medications but is unable to check blood pressure at home. He denies headache, leg swelling, or chest pain. Discussed his previous +FOBT and need for a colonoscopy. He states that he will do this eventually but wants to wait until the virus has cleared. He denies changes in BM, constipation, rectal pain, abdominal pain, dark stool, BRB, or nausea.   Please see A&P for assessment of the patient's acute and chronic medical conditions.    Past Medical History:  Diagnosis Date  . COPD (chronic obstructive pulmonary disease) (HCC) 10/01/2006  . Drug-induced hepatic toxicity 06/21/2007  . Eczema  10/01/2006  . Hyperlipidemia 10/01/2006  . Hypertension 10/01/2006  . Learning disability 01/07/2007   Determined in court for disability  . Sleep apnea 10/01/2006   mild to moderate  . Tobacco abuse 01/07/2007   Review of Systems:   Review of Systems  Constitutional: Negative for weight loss.  HENT: Negative for sore throat.   Respiratory: Negative for cough, sputum production and shortness of breath.   Cardiovascular: Negative for chest pain and leg swelling.  Genitourinary: Negative for dysuria and urgency.  Further ROS negative except as noted in HPI.   Assessment & Plan:   See Encounters Tab for problem based charting.  Patient discussed with Dr. Criselda Peaches

## 2019-05-09 ENCOUNTER — Encounter: Payer: Self-pay | Admitting: Internal Medicine

## 2019-05-09 NOTE — Progress Notes (Signed)
Internal Medicine Clinic Attending  Case discussed with Dr. Seawell soon after the resident saw the patient.  We reviewed the resident's history, telephone conversation and pertinent patient test results.  I agree with the assessment, diagnosis, and plan of care documented in the resident's note.   

## 2019-05-09 NOTE — Assessment & Plan Note (Addendum)
He continues to take his blood pressure medications but is unable to check blood pressure at home. He denies headache, leg swelling, or chest pain. Does not need refills.  - continue norvasc 10 mg qd  - continue losartan 100 mg qd

## 2019-05-09 NOTE — Assessment & Plan Note (Signed)
He continues to walk around the apartment with his mask and has been eating more food at home. He wears gloves and mask when he goes anywhere. He has been using his inhalers at home and these help relieve any shortness of breath. He does not require his prn inhalers every day. He denies cough, sore throat, or shortness of breath. He does still smoke 3-4 cigarettes per day. He does need a refill on his inhalers including advair, albuterol and atrovent.   - refilled advair 250-50 mcg two puffs bid, albuterol 2 puffs q6h prn, and atrovent 17 mcg inhaler 2 puffs q6h prn

## 2019-05-09 NOTE — Assessment & Plan Note (Addendum)
Continues to smoke 2-3 cigarettes per day. He is not interested in total cessation right now.

## 2019-05-09 NOTE — Assessment & Plan Note (Signed)
Discussed his previous +FOBT and need for a colonoscopy. He states that he will do this eventually but wants to wait until the virus has cleared. I stressed that he needs a colonoscopy as the BRB could indicate active bleeding. He has never had a colonoscopy before. He states he will make arrangement for one next time he comes to clinic. He denies changes in BM, constipation, rectal pain, abdominal pain, dark stool, BRB, or nausea.   - discuss colonoscopy at follow-up

## 2019-06-17 ENCOUNTER — Other Ambulatory Visit: Payer: Self-pay | Admitting: *Deleted

## 2019-06-17 MED ORDER — AMLODIPINE BESYLATE 10 MG PO TABS: 10.0000 mg | ORAL_TABLET | Freq: Every day | ORAL | 3 refills | Status: DC

## 2019-07-15 ENCOUNTER — Telehealth: Payer: Self-pay | Admitting: *Deleted

## 2019-07-15 NOTE — Telephone Encounter (Signed)
Call placed to patient to offer AWV. Patient would like to do this on 07/22/2019 at 2:30 PM via telehealth. Hubbard Hartshorn, RN, BSN

## 2019-07-16 ENCOUNTER — Encounter: Payer: Self-pay | Admitting: Internal Medicine

## 2019-07-16 ENCOUNTER — Other Ambulatory Visit: Payer: Self-pay

## 2019-07-16 ENCOUNTER — Ambulatory Visit (INDEPENDENT_AMBULATORY_CARE_PROVIDER_SITE_OTHER): Payer: Medicare Other | Admitting: Internal Medicine

## 2019-07-16 VITALS — BP 140/82 | HR 100 | Temp 98.2°F | Ht 70.5 in | Wt 240.9 lb

## 2019-07-16 DIAGNOSIS — F17200 Nicotine dependence, unspecified, uncomplicated: Secondary | ICD-10-CM | POA: Diagnosis not present

## 2019-07-16 DIAGNOSIS — I10 Essential (primary) hypertension: Secondary | ICD-10-CM | POA: Diagnosis not present

## 2019-07-16 DIAGNOSIS — R195 Other fecal abnormalities: Secondary | ICD-10-CM | POA: Diagnosis not present

## 2019-07-16 DIAGNOSIS — E785 Hyperlipidemia, unspecified: Secondary | ICD-10-CM

## 2019-07-16 DIAGNOSIS — J439 Emphysema, unspecified: Secondary | ICD-10-CM

## 2019-07-16 NOTE — Patient Instructions (Signed)
Thank you for allowing [place] to provide your care today. Today we discussed your high blood pressure and emphysema.    I have ordered the following labs for you:  Lipid panel and basic metabolic panel    I will call if any are abnormal.    I have placed a referral for colonoscopy. They will call you to schedule an appointment.   Please follow-up in three months. Please do not take your losartan the day before and of your next appointment.    Should you have any questions or concerns please call the internal medicine clinic at [phone].

## 2019-07-16 NOTE — Assessment & Plan Note (Signed)
2-3 cigarettes per day. He is not interested in smoking cessation currently. Counseling given.

## 2019-07-16 NOTE — Assessment & Plan Note (Signed)
BP Readings from Last 3 Encounters:  07/16/19 140/82  10/08/18 136/84  03/13/18 139/82   Blood pressure borderline elevated on amlodipine 10 mg qd, HCTZ 25 mg qd, losartan 100 mg qd, and metoprolol xl 200 mg qd. He brought all of his medications today and states he has been taking them daily.   - continue current medications - f/u in three months - patient instructed to hold losartan the day before and of his appointment to check PRA level at this time as he does not have a clear picture for he continues to have nearly elevated bp on near max therapy.  - BMP today

## 2019-07-16 NOTE — Assessment & Plan Note (Signed)
Previous FIT test positive last year. He has been hesitant to have a colonoscopy, even more so with the current coronavirus pandemic but is agreeable today

## 2019-07-16 NOTE — Assessment & Plan Note (Signed)
He is taking crestor 20 mg qd. Last lipid panel 06/2016. Will check lipid panel to make sure medication dose is adequate.   - lipid panel

## 2019-07-16 NOTE — Assessment & Plan Note (Signed)
This is stable and controlled on his current medications.   - Continue advair diskus 250-50 mcg 2 puffs bid, albuterol and atrovent 2 puffs q6h prn

## 2019-07-16 NOTE — Progress Notes (Signed)
   CC: blood pressure check, f/u positive fobt  HPI:  Mr.Geramy D Winokur is a 63 y.o. with PMH as below.   Please see A&P for assessment of the patient's acute and chronic medical conditions.    Past Medical History:  Diagnosis Date  . COPD (chronic obstructive pulmonary disease) ([place]) 10/01/2006  . Drug-induced hepatic toxicity 06/21/2007  . Eczema 10/01/2006  . Hyperlipidemia 10/01/2006  . Hypertension 10/01/2006  . Learning disability 01/07/2007   Determined in court for disability  . Sleep apnea 10/01/2006   mild to moderate  . Tobacco abuse 01/07/2007   Review of Systems:   Review of Systems  Constitutional: Negative for chills, fever and weight loss.  Respiratory: Negative for cough, sputum production, shortness of breath and wheezing.   Cardiovascular: Negative for chest pain and leg swelling.  Gastrointestinal: Negative for blood in stool, constipation, heartburn, melena, nausea and vomiting.   Physical Exam:  Constitution: NAD, appears stated age Cardio: RRR, no m/r/g  Respiratory: CTA, no w/r/r  Abdominal: soft, non-distended, NTTP, +BS   Vitals:   07/16/19 1304 07/16/19 1333  BP: (!) 145/90 140/82  Pulse: 100   Temp: 98.2 F (36.8 C)   SpO2: 100%   Weight: 240 lb 14.4 oz (109.3 kg)   Height: 5' 10.5" (1.791 m)      Assessment & Plan:   See Encounters Tab for problem based charting.  Patient discussed with Dr. Dareen Piano

## 2019-07-17 LAB — LIPID PANEL
Chol/HDL Ratio: 4.2 ratio (ref 0.0–5.0)
Cholesterol, Total: 146 mg/dL (ref 100–199)
HDL: 35 mg/dL — ABNORMAL LOW (ref >39)
LDL Calculated: 81 mg/dL (ref 0–99)
Triglycerides: 149 mg/dL (ref 0–149)
VLDL Cholesterol Cal: 30 mg/dL (ref 5–40)

## 2019-07-17 LAB — BMP8+ANION GAP
Anion Gap: 16 mmol/L (ref 10.0–18.0)
BUN/Creatinine Ratio: 13 (ref 10–24)
BUN: 15 mg/dL (ref 8–27)
CO2: 20 mmol/L (ref 20–29)
Calcium: 9.4 mg/dL (ref 8.6–10.2)
Chloride: 102 mmol/L (ref 96–106)
Creatinine, Ser: 1.16 mg/dL (ref 0.76–1.27)
GFR calc Af Amer: 77 mL/min/{1.73_m2} (ref >59)
GFR calc non Af Amer: 67 mL/min/{1.73_m2} (ref >59)
Glucose: 101 mg/dL — ABNORMAL HIGH (ref 65–99)
Potassium: 3.5 mmol/L (ref 3.5–5.2)
Sodium: 138 mmol/L (ref 134–144)

## 2019-07-21 NOTE — Progress Notes (Signed)
Internal Medicine Clinic Attending  Case discussed with Dr. Seawell at the time of the visit.  We reviewed the resident's history and exam and pertinent patient test results.  I agree with the assessment, diagnosis, and plan of care documented in the resident's note.    

## 2019-07-21 NOTE — Addendum Note (Signed)
Addended by: Aldine Contes on: 07/21/2019 10:28 AM   Modules accepted: Level of Service

## 2019-07-21 NOTE — Addendum Note (Signed)
Addended by: Aldine Contes on: 07/21/2019 10:16 AM   Modules accepted: Level of Service

## 2019-07-22 ENCOUNTER — Ambulatory Visit (INDEPENDENT_AMBULATORY_CARE_PROVIDER_SITE_OTHER): Payer: Medicare Other | Admitting: Internal Medicine

## 2019-07-22 ENCOUNTER — Encounter: Payer: Self-pay | Admitting: Internal Medicine

## 2019-07-22 ENCOUNTER — Other Ambulatory Visit: Payer: Self-pay

## 2019-07-22 VITALS — Ht 70.0 in

## 2019-07-22 DIAGNOSIS — Z0000 Encounter for general adult medical examination without abnormal findings: Secondary | ICD-10-CM | POA: Diagnosis not present

## 2019-07-22 NOTE — Patient Instructions (Addendum)
Annual Wellness Visit   Medicare Covered Preventative Screenings and Services  Services & Screenings Men and Women Who How Often Need? Date of Last Service Action  Abdominal Aortic Aneurysm Adults with AAA risk factors Once     Alcohol Misuse and Counseling All Adults Screening once a year if no alcohol misuse. Counseling up to 4 face to face sessions.     Bone Density Measurement  Adults at risk for osteoporosis Once every 2 yrs     Lipid Panel Z13.6 All adults without CV disease Once every 5 yrs     Colorectal Cancer   Stool sample or  Colonoscopy All adults 50 and older   Once every year  Every 10 years  Yes  Please call [facility] at [phone] to schedule colonoscopy.  Depression All Adults Once a year  Today   Diabetes Screening Blood glucose, post glucose load, or GTT Z13.1  All adults at risk  Pre-diabetics  Once per year  Twice per year     Diabetes  Self-Management Training All adults Diabetics 10 hrs first year; 2 hours subsequent years. Requires Copay     Glaucoma  Diabetics  Family history of glaucoma  African Americans 50 yrs +  Hispanic Americans 65 yrs + Annually - requires coppay     Hepatitis C Z72.89 or F19.20  High Risk for HCV  Born between 1945 and 1965  Annually  Once  Yes    HIV Z11.4 All adults based on risk  Annually btw ages 8515 & 2665 regardless of risk  Annually > 65 yrs if at increased risk     Lung Cancer Screening Asymptomatic adults aged 64-77 with 30 pack yr history and current smoker OR quit within the last 15 yrs Annually Must have counseling and shared decision making documentation before first screen  Yes    Medical Nutrition Therapy Adults with   Diabetes  Renal disease  Kidney transplant within past 3 yrs 3 hours first year; 2 hours subsequent years     Obesity and Counseling All adults Screening once a year Counseling if BMI 30 or higher  Today   Tobacco Use Counseling Adults who use tobacco  Up to 8 visits in  one year Yes   Call 1-800-QUIT-NOW  Vaccines Z23  Hepatitis B  Influenza   Pneumonia  Adults   Once  Once every flu season  Two different vaccines separated by one year   Yes       Flu vaccine starting     September 1  Next Annual Wellness Visit People with Medicare Every year  Today     Services & Screenings Women Who How Often Need  Date of Last Service Action  Mammogram  Z12.31 Women over 40 One baseline ages 5335-39. Annually ager 40 yrs+     Pap tests All women Annually if high risk. Every 2 yrs for normal risk women     Screening for cervical cancer with   Pap (Z01.419 nl or Z01.411abnl) &  HPV Z11.51 Women aged 64 to 6665 Once every 5 yrs     Screening pelvic and breast exams All women Annually if high risk. Every 2 yrs for normal risk women     Sexually Transmitted Diseases  Chlamydia  Gonorrhea  Syphilis All at risk adults Annually for non pregnant females at increased risk         Services & Screenings Men Who How Ofter Need  Date of Last Service Action  Prostate Cancer -  DRE & PSA Men over 50 Annually.  DRE might require a copay.     Sexually Transmitted Diseases  Syphilis All at risk adults Annually for men at increased risk         Things That May Be Affecting Your Health:  Alcohol  Hearing loss  Pain    Depression  Home Safety  Sexual Health   Diabetes  Lack of physical activity  Stress   Difficulty with daily activities  Loneliness  Tiredness   Drug use  Medicines X Tobacco use   Falls  Motor Vehicle Safety  Weight   Food choices  Oral Health  Other    YOUR PERSONALIZED HEALTH PLAN : 1. Schedule your next subsequent Medicare Wellness visit in one year 2. Attend all of your regular appointments to address your medical issues 3. Complete the preventative screenings and services 4. Please call [facility] at [phone] to schedule colonoscopy. 5. Call 1-800-QUIT-NOW for resources to help quit smoking   Fall Prevention in the Home,  Adult Falls can cause injuries. They can happen to people of all ages. There are many things you can do to make your home safe and to help prevent falls. Ask for help when making these changes, if needed. What actions can I take to prevent falls? General Instructions  Use good lighting in all rooms. Replace any light bulbs that burn out.  Turn on the lights when you go into a dark area. Use night-lights.  Keep items that you use often in easy-to-reach places. Lower the shelves around your home if necessary.  Set up your furniture so you have a clear path. Avoid moving your furniture around.  Do not have throw rugs and other things on the floor that can make you trip.  Avoid walking on wet floors.  If any of your floors are uneven, fix them.  Add color or contrast paint or tape to clearly mark and help you see: ? Any grab bars or handrails. ? First and last steps of stairways. ? Where the edge of each step is.  If you use a stepladder: ? Make sure that it is fully opened. Do not climb a closed stepladder. ? Make sure that both sides of the stepladder are locked into place. ? Ask someone to hold the stepladder for you while you use it.  If there are any pets around you, be aware of where they are. What can I do in the bathroom?      Keep the floor dry. Clean up any water that spills onto the floor as soon as it happens.  Remove soap buildup in the tub or shower regularly.  Use non-skid mats or decals on the floor of the tub or shower.  Attach bath mats securely with double-sided, non-slip rug tape.  If you need to sit down in the shower, use a plastic, non-slip stool.  Install grab bars by the toilet and in the tub and shower. Do not use towel bars as grab bars. What can I do in the bedroom?  Make sure that you have a light by your bed that is easy to reach.  Do not use any sheets or blankets that are too big for your bed. They should not hang down onto the floor.   Have a firm chair that has side arms. You can use this for support while you get dressed. What can I do in the kitchen?  Clean up any spills right away.  If you need  to reach something above you, use a strong step stool that has a grab bar.  Keep electrical cords out of the way.  Do not use floor polish or wax that makes floors slippery. If you must use wax, use non-skid floor wax. What can I do with my stairs?  Do not leave any items on the stairs.  Make sure that you have a light switch at the top of the stairs and the bottom of the stairs. If you do not have them, ask someone to add them for you.  Make sure that there are handrails on both sides of the stairs, and use them. Fix handrails that are broken or loose. Make sure that handrails are as long as the stairways.  Install non-slip stair treads on all stairs in your home.  Avoid having throw rugs at the top or bottom of the stairs. If you do have throw rugs, attach them to the floor with carpet tape.  Choose a carpet that does not hide the edge of the steps on the stairway.  Check any carpeting to make sure that it is firmly attached to the stairs. Fix any carpet that is loose or worn. What can I do on the outside of my home?  Use bright outdoor lighting.  Regularly fix the edges of walkways and driveways and fix any cracks.  Remove anything that might make you trip as you walk through a door, such as a raised step or threshold.  Trim any bushes or trees on the path to your home.  Regularly check to see if handrails are loose or broken. Make sure that both sides of any steps have handrails.  Install guardrails along the edges of any raised decks and porches.  Clear walking paths of anything that might make someone trip, such as tools or rocks.  Have any leaves, snow, or ice cleared regularly.  Use sand or salt on walking paths during winter.  Clean up any spills in your garage right away. This includes grease or oil  spills. What other actions can I take?  Wear shoes that: ? Have a low heel. Do not wear high heels. ? Have rubber bottoms. ? Are comfortable and fit you well. ? Are closed at the toe. Do not wear open-toe sandals.  Use tools that help you move around (mobility aids) if they are needed. These include: ? Canes. ? Walkers. ? Scooters. ? Crutches.  Review your medicines with your doctor. Some medicines can make you feel dizzy. This can increase your chance of falling. Ask your doctor what other things you can do to help prevent falls. Where to find more information  Centers for Disease Control and Prevention, STEADI: HealthcareCounselor.com.pthttps://cdc.gov  [employer] on Aging: RingConnections.sihttps://go4life.nia.nih.gov Contact a doctor if:  You are afraid of falling at home.  You feel weak, drowsy, or dizzy at home.  You fall at home. Summary  There are many simple things that you can do to make your home safe and to help prevent falls.  Ways to make your home safe include removing tripping hazards and installing grab bars in the bathroom.  Ask for help when making these changes in your home. This information is not intended to replace advice given to you by your health care provider. Make sure you discuss any questions you have with your health care provider. Document Released: 10/07/2009 Document Revised: 04/03/2019 Document Reviewed: 07/26/2017 Elsevier Patient Education  2020 [employer]   Health Maintenance, Male Adopting a healthy lifestyle  and getting preventive care are important in promoting health and wellness. Ask your health care provider about:  The right schedule for you to have regular tests and exams.  Things you can do on your own to prevent diseases and keep yourself healthy. What should I know about diet, weight, and exercise? Eat a healthy diet   Eat a diet that includes plenty of vegetables, fruits, low-fat dairy products, and lean protein.  Do not eat a lot of foods that  are high in solid fats, added sugars, or sodium. Maintain a healthy weight Body mass index (BMI) is a measurement that can be used to identify possible weight problems. It estimates body fat based on height and weight. Your health care provider can help determine your BMI and help you achieve or maintain a healthy weight. Get regular exercise Get regular exercise. This is one of the most important things you can do for your health. Most adults should:  Exercise for at least 150 minutes each week. The exercise should increase your heart rate and make you sweat (moderate-intensity exercise).  Do strengthening exercises at least twice a week. This is in addition to the moderate-intensity exercise.  Spend less time sitting. Even light physical activity can be beneficial. Watch cholesterol and blood lipids Have your blood tested for lipids and cholesterol at 64 years of age, then have this test every 5 years. You may need to have your cholesterol levels checked more often if:  Your lipid or cholesterol levels are high.  You are older than 64 years of age.  You are at high risk for heart disease. What should I know about cancer screening? Many types of cancers can be detected early and may often be prevented. Depending on your health history and family history, you may need to have cancer screening at various ages. This may include screening for:  Colorectal cancer.  Prostate cancer.  Skin cancer.  Lung cancer. What should I know about heart disease, diabetes, and high blood pressure? Blood pressure and heart disease  High blood pressure causes heart disease and increases the risk of stroke. This is more likely to develop in people who have high blood pressure readings, are of African descent, or are overweight.  Talk with your health care provider about your target blood pressure readings.  Have your blood pressure checked: ? Every 3-5 years if you are 1218-739 years of age. ? Every  year if you are 64 years old or older.  If you are between the ages of 3265 and 6875 and are a current or former smoker, ask your health care provider if you should have a one-time screening for abdominal aortic aneurysm (AAA). Diabetes Have regular diabetes screenings. This checks your fasting blood sugar level. Have the screening done:  Once every three years after age 64 if you are at a normal weight and have a low risk for diabetes.  More often and at a younger age if you are overweight or have a high risk for diabetes. What should I know about preventing infection? Hepatitis B If you have a higher risk for hepatitis B, you should be screened for this virus. Talk with your health care provider to find out if you are at risk for hepatitis B infection. Hepatitis C Blood testing is recommended for:  Everyone born from [birth year] through 1965.  Anyone with known risk factors for hepatitis C. Sexually transmitted infections (STIs)  You should be screened each year for STIs, including gonorrhea and chlamydia,  if: ? You are sexually active and are younger than 64 years of age. ? You are older than 64 years of age and your health care provider tells you that you are at risk for this type of infection. ? Your sexual activity has changed since you were last screened, and you are at increased risk for chlamydia or gonorrhea. Ask your health care provider if you are at risk.  Ask your health care provider about whether you are at high risk for HIV. Your health care provider may recommend a prescription medicine to help prevent HIV infection. If you choose to take medicine to prevent HIV, you should first get tested for HIV. You should then be tested every 3 months for as long as you are taking the medicine. Follow these instructions at home: Lifestyle  Do not use any products that contain nicotine or tobacco, such as cigarettes, e-cigarettes, and chewing tobacco. If you need help quitting, ask your health  care provider.  Do not use street drugs.  Do not share needles.  Ask your health care provider for help if you need support or information about quitting drugs. Alcohol use  Do not drink alcohol if your health care provider tells you not to drink.  If you drink alcohol: ? Limit how much you have to 0-2 drinks a day. ? Be aware of how much alcohol is in your drink. In the [place] one drink equals one 12 oz bottle of beer (355 mL), one 5 oz glass of wine (148 mL), or one 1 oz glass of hard liquor (44 mL). General instructions  Schedule regular health, dental, and eye exams.  Stay current with your vaccines.  Tell your health care provider if: ? You often feel depressed. ? You have ever been abused or do not feel safe at home. Summary  Adopting a healthy lifestyle and getting preventive care are important in promoting health and wellness.  Follow your health care provider's instructions about healthy diet, exercising, and getting tested or screened for diseases.  Follow your health care provider's instructions on monitoring your cholesterol and blood pressure. This information is not intended to replace advice given to you by your health care provider. Make sure you discuss any questions you have with your health care provider. Document Released: 06/08/2008 Document Revised: 12/04/2018 Document Reviewed: 12/04/2018 Elsevier Patient Education  2020 [employer].

## 2019-07-22 NOTE — Progress Notes (Signed)
This AWV is being conducted by [place] only. The patient was located at home and I was located in [facility]. The patient's identity was confirmed using their DOB and current address. The patient or his/her legal guardian has consented to being evaluated through a telephone encounter and understands the associated risks (an examination cannot be done and the patient may need to come in for an appointment) / benefits (allows the patient to remain at home, decreasing exposure to coronavirus). I personally spent 29 minutes conducting the AWV.  Subjective:   Christopher Thompson is a 63 y.o. male who presents for a Medicare Annual Wellness Visit.  The following items have been reviewed and updated today in the appropriate area in the EMR.   Health Risk Assessment  Height, weight, BMI, and BP Visual acuity if needed Depression screen Fall risk / safety level Advance directive discussion Medical and family history were reviewed and updated Updating list of other providers & suppliers Medication reconciliation, including over the counter medicines Cognitive screen Written screening schedule Risk Factor list Personalized health advice, risky behaviors, and treatment advice  Social History   Social History Narrative   Current Social History 07/22/2019        Patient lives alone in an apartment which is on the second floor. There are steps with handrails up to the entrance the patient uses.       Patient's method of transportation is personal car.      The highest level of education was 7th grade      The patient currently disabled.      Identified important Relationships are "My friend, Meredith Mody."       Pets : None       Interests / Fun: "Stay at home and watch TV. Walk every morning."       Current Stressors: "I really don't have any."       Religious / Personal Beliefs: Baptist       L. Tachina Spoonemore, RN, BSN             Objective:    Vitals: Ht 5\' 10"  (1.778 m)   BMI 34.57 kg/m   Vitals are unable to obtained due to [id number]-[id number] public health emergency  Activities of Daily Living In your present state of health, do you have any difficulty performing the following activities: 07/22/2019 07/16/2019  Hearing? N N  Vision? N N  Difficulty concentrating or making decisions? N N  Walking or climbing stairs? N N  Dressing or bathing? N N  Doing errands, shopping? N N  Some recent data might be hidden    Goals Goals    . Blood Pressure < 150/90     Per JNC8    . LDL CALC < 130    . Patient Stated (pt-stated)     Get colonoscopy by end of 2020    . Quit Smoking (pt-stated)     By the end of 2020     Information provided on 1-800-QUIT-NOW   Fall Risk Fall Risk  07/22/2019 07/16/2019 10/08/2018 03/13/2018 09/06/2017  Falls in the past year? 0 0 No No No  Follow up Education provided;Falls prevention discussed Falls prevention discussed - - -  CDC Handout on Fall Prevention and Handout on Home Exercise Program, Access codes [email or web address] and [email or web address] given/mailed to patient with red exercise band.    Depression Screen PHQ 2/9 Scores 07/22/2019 07/16/2019 10/08/2018 03/13/2018  PHQ - 2 Score 0 0 0 0  PHQ- 9  Score - - 0 -     Cognitive Testing Six-Item Cognitive Screener   "I would like to ask you some questions that ask you to use your memory. I am going to name three objects. Please wait until I say all three words, then repeat them. Remember what they are  because I am going to ask you to name them again in a few minutes. Please repeat these words for me: APPLE-TABLE-PENNY." (Interviewer may repeat names 3 times if necessary but repetition not scored.)  Did patient correctly repeat all three words? Yes - may proceed with screen  What year is this? Correct What month is this? Correct What day of the week is this? Correct  What were the three objects I asked you to remember? . Apple Correct . Table Correct . Boyd Kerbsenny Unable to remember  Score one point for each  incorrect answer.  A score of 2 or more points warrants additional investigation.  Patient's score 1   Patient given number to [facility] to call and schedule colonoscopy Assessment and Plan:     Patient given number to  GI to call and schedule colonoscopy  During the course of the visit the patient was educated and counseled about appropriate screening and preventive services as documented in the assessment and plan.  The printed AVS was given to the patient and included an updated screening schedule, a list of risk factors, and personalized health advice.        Fredderick SeveranceLaurenze L Ettie Krontz, RN  07/22/2019

## 2019-07-22 NOTE — Progress Notes (Signed)
I discussed the AWV findings with the RN who conducted the visit. I was present in the office suite and immediately available to provide assistance and direction throughout the time the service was provided.  Marty Heck, DO 07/22/2019, 5:23 PM Pager: [phone]

## 2019-07-23 ENCOUNTER — Encounter: Payer: Self-pay | Admitting: *Deleted

## 2019-07-29 NOTE — Progress Notes (Signed)
Internal Medicine Clinic Attending  Case discussed with Dr. Seawell at the time of the visit.  We reviewed the AWV findings.  I agree with the assessment, diagnosis, and plan of care documented in the AWV note.     

## 2019-08-26 NOTE — Addendum Note (Signed)
Addended by: Hulan Fray on: 08/26/2019 07:25 AM   Modules accepted: Orders

## 2019-09-08 ENCOUNTER — Other Ambulatory Visit: Payer: Self-pay | Admitting: Internal Medicine

## 2019-09-08 DIAGNOSIS — I10 Essential (primary) hypertension: Secondary | ICD-10-CM

## 2019-10-03 ENCOUNTER — Other Ambulatory Visit: Payer: Self-pay | Admitting: Internal Medicine

## 2019-10-03 DIAGNOSIS — I10 Essential (primary) hypertension: Secondary | ICD-10-CM

## 2019-10-17 ENCOUNTER — Other Ambulatory Visit: Payer: Self-pay | Admitting: Internal Medicine

## 2019-10-17 DIAGNOSIS — J439 Emphysema, unspecified: Secondary | ICD-10-CM

## 2019-11-05 ENCOUNTER — Encounter: Payer: Medicare Other | Admitting: Internal Medicine

## 2019-11-13 ENCOUNTER — Other Ambulatory Visit: Payer: Self-pay | Admitting: Internal Medicine

## 2019-11-13 DIAGNOSIS — I10 Essential (primary) hypertension: Secondary | ICD-10-CM

## 2019-11-13 DIAGNOSIS — J439 Emphysema, unspecified: Secondary | ICD-10-CM

## 2020-02-02 NOTE — Addendum Note (Signed)
Addended by: Guinevere Scarlet A on: 02/02/2020 10:56 PM   Modules accepted: Orders

## 2020-02-03 ENCOUNTER — Telehealth: Payer: Self-pay | Admitting: Internal Medicine

## 2020-02-03 NOTE — Telephone Encounter (Signed)
RTC to patient, states he received a phone call this morning from [facility] and was told to hold a medication today and tomorrow, he can't remember which med to hold.  This RN researched his chart and per chart Dr. Al Decant last office note states "f/u in three months - patient instructed to hold losartan the day before and of his appointment to check PRA level at this time as he does not have a clear picture for he continues to have nearly elevated bp on near max therapy."  Pt notified of above and verbalized understanding. SChaplin, RN,BSN

## 2020-02-03 NOTE — Telephone Encounter (Signed)
Pls contact pt [phone]; regarding medicine

## 2020-02-04 ENCOUNTER — Ambulatory Visit (INDEPENDENT_AMBULATORY_CARE_PROVIDER_SITE_OTHER): Payer: Medicare Other | Admitting: Internal Medicine

## 2020-02-04 VITALS — BP 159/83 | Wt 244.8 lb

## 2020-02-04 DIAGNOSIS — J439 Emphysema, unspecified: Secondary | ICD-10-CM | POA: Diagnosis not present

## 2020-02-04 DIAGNOSIS — I10 Essential (primary) hypertension: Secondary | ICD-10-CM | POA: Diagnosis not present

## 2020-02-04 DIAGNOSIS — Z23 Encounter for immunization: Secondary | ICD-10-CM

## 2020-02-04 DIAGNOSIS — R195 Other fecal abnormalities: Secondary | ICD-10-CM | POA: Diagnosis not present

## 2020-02-04 DIAGNOSIS — G4733 Obstructive sleep apnea (adult) (pediatric): Secondary | ICD-10-CM | POA: Diagnosis not present

## 2020-02-04 NOTE — Patient Instructions (Signed)
Thank you for allowing [place] to provide your care today. Today we discussed your history of sleep apnea and positive fecal occult blood test.    I have ordered the following labs for you:  Basic metabolic panel, complete blood panel   I have ordered a sleep study for your. They will call you to schedule a time to complete this.  I have placed a referral to Gastroenterology to complete a colonoscopy for your positive fecal occult blood test. They will call to schedule an appointment.   Please follow-up in six months for labs.    Please call the internal medicine center clinic if you have any questions or concerns, we may be able to help and keep you from a long and expensive emergency room wait. Our clinic and after hours phone number is [phone], the best time to call is Monday through Friday 9 am to 4 pm but there is always someone available 24/7 if you have an emergency. If you need medication refills please notify your pharmacy one week in advance and they will send [place] a request.

## 2020-02-04 NOTE — Assessment & Plan Note (Signed)
This problem is stable. His advair and atrovent are working well for him and he has no shortness of breath or wheezing.

## 2020-02-04 NOTE — Assessment & Plan Note (Signed)
Referred for colonoscopy but he has not yet made this appointment. Discussed that completing this is important for colon cancer screening considering postive FIT test. He states understanding and would like to get this done. No melena or BRB in stool, weight loss, or other alarm symptoms.   - referral for colonoscopy - CBC with diff

## 2020-02-04 NOTE — Assessment & Plan Note (Addendum)
History of refractory hypertension. Held lisinopril two days prior to appointment today to check PRA so BP expected to be elevated today. Refractory hypertension also possibly secondary to OSA, which he used to be treated for. He would like to have his sleep study done at home if possible.   -home sleep study.  - PRA - BMP   - restart lisinopril after visit - continue HCTZ, norvasc, and metoprolol.

## 2020-02-04 NOTE — Progress Notes (Signed)
   CC: refractory hypertension  HPI:  [name] is a 64 y.o. with PMH as below.   Please see A&P for assessment of the patient's acute and chronic medical conditions.   Past Medical History:  Diagnosis Date  . COPD (chronic obstructive pulmonary disease) (HCC) 10/01/2006  . Drug-induced hepatic toxicity 06/21/2007  . Eczema 10/01/2006  . Hyperlipidemia 10/01/2006  . Hypertension 10/01/2006  . Learning disability 01/07/2007   Determined in court for disability  . Sleep apnea 10/01/2006   mild to moderate  . Tobacco abuse 01/07/2007   Review of Systems:   Review of Systems  Constitutional: Negative for malaise/fatigue and weight loss.  HENT: Negative for congestion and sore throat.   Respiratory: Negative for cough and shortness of breath.   Cardiovascular: Negative for chest pain, palpitations, claudication and leg swelling.  Gastrointestinal: Negative for abdominal pain, blood in stool, constipation, diarrhea, melena and nausea.  Neurological: Negative for dizziness, weakness and headaches.   Physical Exam:   Constitution: NAD, appears stated age  Cardio: RRR, no m/r/g, no LE edema  Respiratory: CTA, no w/r/r Abdominal: NTTP, soft, non-distended MSK: moving all extremities Neuro: normal affect, a&ox3 Skin: c/d/i    Vitals:   02/04/20 1402  BP: (!) 159/83  SpO2: 98%  Weight: 244 lb 12.8 oz (111 kg)     Assessment & Plan:   See Encounters Tab for problem based charting.  Patient discussed with Dr. Sandre Kitty

## 2020-02-05 LAB — CBC WITH DIFFERENTIAL/PLATELET
Basophils Absolute: 0 10*3/uL (ref 0.0–0.2)
Basos: 0 %
EOS (ABSOLUTE): 0.1 10*3/uL (ref 0.0–0.4)
Eos: 1 %
Hematocrit: 46.6 % (ref 37.5–51.0)
Hemoglobin: 16.1 g/dL (ref 13.0–17.7)
Immature Grans (Abs): 0 10*3/uL (ref 0.0–0.1)
Immature Granulocytes: 0 %
Lymphocytes Absolute: 4 10*3/uL — ABNORMAL HIGH (ref 0.7–3.1)
Lymphs: 45 %
MCH: 31.3 pg (ref 26.6–33.0)
MCHC: 34.5 g/dL (ref 31.5–35.7)
MCV: 91 fL (ref 79–97)
Monocytes Absolute: 1 10*3/uL — ABNORMAL HIGH (ref 0.1–0.9)
Monocytes: 11 %
Neutrophils Absolute: 3.9 10*3/uL (ref 1.4–7.0)
Neutrophils: 43 %
Platelets: 191 10*3/uL (ref 150–450)
RBC: 5.14 x10E6/uL (ref 4.14–5.80)
RDW: 13.9 % (ref 11.6–15.4)
WBC: 9.1 10*3/uL (ref 3.4–10.8)

## 2020-02-10 LAB — BASIC METABOLIC PANEL
BUN/Creatinine Ratio: 12 (ref 10–24)
BUN: 12 mg/dL (ref 8–27)
CO2: 23 mmol/L (ref 20–29)
Calcium: 9.3 mg/dL (ref 8.6–10.2)
Chloride: 103 mmol/L (ref 96–106)
Creatinine, Ser: 0.98 mg/dL (ref 0.76–1.27)
GFR calc Af Amer: 94 mL/min/{1.73_m2} (ref >59)
GFR calc non Af Amer: 81 mL/min/{1.73_m2} (ref >59)
Glucose: 106 mg/dL — ABNORMAL HIGH (ref 65–99)
Potassium: 3.4 mmol/L — ABNORMAL LOW (ref 3.5–5.2)
Sodium: 139 mmol/L (ref 134–144)

## 2020-02-10 LAB — ALDOSTERONE + RENIN ACTIVITY W/ RATIO
ALDOS/RENIN RATIO: 2.5 (ref 0.0–30.0)
ALDOSTERONE: 4.4 ng/dL (ref 0.0–30.0)
Renin: 1.752 ng/mL/hr (ref 0.167–5.380)

## 2020-02-12 NOTE — Progress Notes (Signed)
Internal Medicine Clinic Attending  Case discussed with Dr. Seawell at the time of the visit.  We reviewed the resident's history and exam and pertinent patient test results.  I agree with the assessment, diagnosis, and plan of care documented in the resident's note.  Kamylle Axelson, M.D., Ph.D.  

## 2020-02-22 ENCOUNTER — Other Ambulatory Visit: Payer: Self-pay | Admitting: Internal Medicine

## 2020-02-22 DIAGNOSIS — J439 Emphysema, unspecified: Secondary | ICD-10-CM

## 2020-02-27 ENCOUNTER — Other Ambulatory Visit: Payer: Self-pay | Admitting: Internal Medicine

## 2020-02-27 DIAGNOSIS — E785 Hyperlipidemia, unspecified: Secondary | ICD-10-CM

## 2020-03-21 ENCOUNTER — Other Ambulatory Visit: Payer: Self-pay | Admitting: Internal Medicine

## 2020-03-21 DIAGNOSIS — J439 Emphysema, unspecified: Secondary | ICD-10-CM

## 2020-03-26 ENCOUNTER — Encounter (HOSPITAL_BASED_OUTPATIENT_CLINIC_OR_DEPARTMENT_OTHER): Payer: Self-pay

## 2020-03-26 ENCOUNTER — Ambulatory Visit (HOSPITAL_BASED_OUTPATIENT_CLINIC_OR_DEPARTMENT_OTHER): Payer: Medicare Other | Admitting: Internal Medicine

## 2020-03-26 ENCOUNTER — Other Ambulatory Visit: Payer: Self-pay

## 2020-04-12 DIAGNOSIS — Z23 Encounter for immunization: Secondary | ICD-10-CM | POA: Diagnosis not present

## 2020-05-03 DIAGNOSIS — Z23 Encounter for immunization: Secondary | ICD-10-CM | POA: Diagnosis not present

## 2020-05-14 ENCOUNTER — Other Ambulatory Visit: Payer: Self-pay | Admitting: Internal Medicine

## 2020-05-14 DIAGNOSIS — J439 Emphysema, unspecified: Secondary | ICD-10-CM

## 2020-06-01 ENCOUNTER — Other Ambulatory Visit: Payer: Self-pay | Admitting: Internal Medicine

## 2020-06-01 DIAGNOSIS — R195 Other fecal abnormalities: Secondary | ICD-10-CM

## 2020-06-01 DIAGNOSIS — G4733 Obstructive sleep apnea (adult) (pediatric): Secondary | ICD-10-CM

## 2020-06-01 DIAGNOSIS — I10 Essential (primary) hypertension: Secondary | ICD-10-CM

## 2020-06-14 ENCOUNTER — Ambulatory Visit (HOSPITAL_BASED_OUTPATIENT_CLINIC_OR_DEPARTMENT_OTHER): Payer: Medicare Other | Attending: Internal Medicine | Admitting: Internal Medicine

## 2020-06-14 ENCOUNTER — Other Ambulatory Visit: Payer: Self-pay

## 2020-06-14 DIAGNOSIS — G4733 Obstructive sleep apnea (adult) (pediatric): Secondary | ICD-10-CM

## 2020-06-14 DIAGNOSIS — G4736 Sleep related hypoventilation in conditions classified elsewhere: Secondary | ICD-10-CM | POA: Insufficient documentation

## 2020-06-30 DIAGNOSIS — G4733 Obstructive sleep apnea (adult) (pediatric): Secondary | ICD-10-CM

## 2020-06-30 NOTE — Procedures (Signed)
    Patient Name: Christopher Thompson, Christopher Thompson Date: 06/14/2020 Gender: Male D.O.B: [birth year] Age (years): 63 Referring Provider: Alvira Philips DO Height (inches): 70 Interpreting Physician: Jetty Duhamel MD, ABSM Weight (lbs): 235 RPSGT: [place] BMI: 34 MRN: [id number] Neck Size: 18.25  CLINICAL INFORMATION Sleep Study Type: HS Indication for sleep study: OSA Epworth Sleepiness Score: 1  SLEEP STUDY TECHNIQUE A multi-channel overnight portable sleep study was performed. The channels recorded were: nasal airflow, thoracic respiratory movement, and oxygen saturation with a pulse oximetry. Snoring was also monitored.  MEDICATIONS Patient self administered medications include: none reported.  SLEEP ARCHITECTURE Patient was studied for 379.5 minutes. The sleep efficiency was 100.0 % and the patient was supine for 0%. The arousal index was 0.0 per hour.  RESPIRATORY PARAMETERS The overall AHI was 23.9 per hour, with a central apnea index of 0.0 per hour. The oxygen nadir was 63% during sleep.  CARDIAC DATA Mean heart rate during sleep was 88.1 bpm.  IMPRESSIONS - Moderate obstructive sleep apnea occurred during this study (AHI = 23.9/h). - No significant central sleep apnea occurred during this study (CAI = 0.0/h). - Oxygen desaturation was noted during this study (Min O2 = 63%). Mean sat 85% - [place] with O2 sat 89% or lesss was 348 minutes. - Patient snored.  DIAGNOSIS - Obstructive Sleep Apnea (327.23 [G47.33 ICD-10]) - Nocturnal Hypoxemia (327.26 [G47.36 ICD-10])  RECOMMENDATIONS - Suggest CPAP titration sleep study. Sustained hypoxemia noted on this diagnostic study suggests in-lab titration should be used to document if supplemental O2 will also be needed. - Be careful with alcohol, sedatives and other CNS depressants that may worsen sleep apnea and disrupt normal sleep architecture. - Sleep hygiene should be reviewed to assess factors that may improve sleep  quality. - Weight management and regular exercise should be initiated or continued.  [Electronically signed] 06/30/2020 02:38 PM  Jetty Duhamel MD, ABSM Diplomate, American Board of Sleep Medicine   NPI: [id number]                         Jetty Duhamel Diplomate, American Board of Sleep Medicine  ELECTRONICALLY SIGNED ON:  06/30/2020, 2:34 PM [facility] PH: [phone]   FX: [phone] ACCREDITED BY THE AMERICAN ACADEMY OF SLEEP MEDICINE

## 2020-08-23 ENCOUNTER — Encounter: Payer: Self-pay | Admitting: Internal Medicine

## 2020-09-14 ENCOUNTER — Other Ambulatory Visit: Payer: Self-pay | Admitting: Internal Medicine

## 2020-09-14 DIAGNOSIS — I10 Essential (primary) hypertension: Secondary | ICD-10-CM

## 2020-10-04 ENCOUNTER — Encounter: Payer: Self-pay | Admitting: Internal Medicine

## 2020-10-04 ENCOUNTER — Other Ambulatory Visit: Payer: Self-pay

## 2020-10-04 ENCOUNTER — Ambulatory Visit (INDEPENDENT_AMBULATORY_CARE_PROVIDER_SITE_OTHER): Payer: Medicare Other | Admitting: Internal Medicine

## 2020-10-04 VITALS — BP 146/97 | HR 98 | Temp 98.4°F | Wt 237.1 lb

## 2020-10-04 DIAGNOSIS — E785 Hyperlipidemia, unspecified: Secondary | ICD-10-CM

## 2020-10-04 DIAGNOSIS — R739 Hyperglycemia, unspecified: Secondary | ICD-10-CM

## 2020-10-04 DIAGNOSIS — F17200 Nicotine dependence, unspecified, uncomplicated: Secondary | ICD-10-CM

## 2020-10-04 DIAGNOSIS — R195 Other fecal abnormalities: Secondary | ICD-10-CM | POA: Diagnosis not present

## 2020-10-04 DIAGNOSIS — Z0000 Encounter for general adult medical examination without abnormal findings: Secondary | ICD-10-CM

## 2020-10-04 DIAGNOSIS — F17210 Nicotine dependence, cigarettes, uncomplicated: Secondary | ICD-10-CM

## 2020-10-04 DIAGNOSIS — G4733 Obstructive sleep apnea (adult) (pediatric): Secondary | ICD-10-CM

## 2020-10-04 DIAGNOSIS — I10 Essential (primary) hypertension: Secondary | ICD-10-CM

## 2020-10-04 DIAGNOSIS — G4730 Sleep apnea, unspecified: Secondary | ICD-10-CM

## 2020-10-04 LAB — GLUCOSE, CAPILLARY: Glucose-Capillary: 101 mg/dL — ABNORMAL HIGH (ref 70–99)

## 2020-10-04 LAB — POCT GLYCOSYLATED HEMOGLOBIN (HGB A1C): Hemoglobin A1C: 5.8 % — AB (ref 4.0–5.6)

## 2020-10-04 NOTE — Progress Notes (Signed)
CC: HTN  HPI:  [name] is a 64 y.o. male with a past medical history stated below and presents today for HTN. Please see problem based assessment and plan for additional details.  Past Medical History:  Diagnosis Date  . COPD (chronic obstructive pulmonary disease) (HCC) 10/01/2006  . Drug-induced hepatic toxicity 06/21/2007  . Eczema 10/01/2006  . Hyperlipidemia 10/01/2006  . Hypertension 10/01/2006  . Learning disability 01/07/2007   Determined in court for disability  . Sleep apnea 10/01/2006   mild to moderate  . Tobacco abuse 01/07/2007    Current Outpatient Medications on File Prior to Visit  Medication Sig Dispense Refill  . ATROVENT HFA 17 MCG/ACT inhaler INHALE 2 PUFFS BY MOUTH EVERY 6 HOURS AS NEEDED 12.9 Inhaler 5  . metoprolol (TOPROL-XL) 200 MG 24 hr tablet TAKE 1 TABLET (200 MG TOTAL) BY MOUTH DAILY. TAKE WITH OR IMMEDIATELY FOLLOWING A MEAL. 90 tablet 3  . ADVAIR DISKUS 250-50 MCG/DOSE AEPB TAKE 2 PUFFS BY MOUTH TWICE A DAY 180 each 2  . amLODipine (NORVASC) 10 MG tablet TAKE 1 TABLET BY MOUTH EVERY DAY 90 tablet 3  . hydrochlorothiazide (HYDRODIURIL) 25 MG tablet TAKE 1 TABLET BY MOUTH EVERY DAY 90 tablet 2  . losartan (COZAAR) 100 MG tablet TAKE 1 TABLET BY MOUTH EVERY DAY 90 tablet 3  . rosuvastatin (CRESTOR) 20 MG tablet TAKE 1 TABLET BY MOUTH EVERY DAY 90 tablet 3  . VENTOLIN HFA 108 (90 Base) MCG/ACT inhaler TAKE 2 PUFFS BY MOUTH EVERY 6 HOURS AS NEEDED FOR WHEEZE OR SHORTNESS OF BREATH 6.7 g 5   No current facility-administered medications on file prior to visit.    Family History  Problem Relation Age of Onset  . Hypertension Mother   . Alzheimer's disease Mother   . Heart disease Father   . Heart attack Father   . Hypertension Father   . Hypertension Sister   . Heart disease Brother   . Hypertension Son   . Hypertension Sister     Social History   Socioeconomic History  . Marital status: Legally Separated    Spouse name: Not on  file  . Number of children: Not on file  . Years of education: 7th grade  . Highest education level: Not on file  Occupational History  . Occupation: disability    Comment: due to learning disability per patient; can only do physical work, and hurt his left shoulder on the job years ago  Tobacco Use  . Smoking status: Current Some Day Smoker    Packs/day: 0.30    Years: 45.00    Pack years: 13.50    Types: Cigarettes    Last attempt to quit: 09/19/2014    Years since quitting: 6.0  . Smokeless tobacco: Never Used  . Tobacco comment: 0-2 cigs per day  Vaping Use  . Vaping Use: Never used  Substance and Sexual Activity  . Alcohol use: No    Alcohol/week: 0.0 standard drinks  . Drug use: No  . Sexual activity: Yes    Birth control/protection: None    Comment: Committed relationship x 8 years  Other Topics Concern  . Not on file  Social History Narrative   Current Social History 07/22/2019        Patient lives alone in an apartment which is on the second floor. There are steps with handrails up to the entrance the patient uses.       Patient's method of transportation is personal car.  The highest level of education was 7th grade      The patient currently disabled.      Identified important Relationships are "My friend, Dedra Skeens."       Pets : None       Interests / Fun: "Stay at home and watch TV. Walk every morning."       Current Stressors: "I really don't have any."       Religious / Personal Beliefs: Baptist       L. Ducatte, RN, BSN       Social Determinants of Health   Financial Resource Strain:   . Difficulty of Paying Living Expenses: Not on file  Food Insecurity:   . Worried About Programme researcher, broadcasting/film/video in the Last Year: Not on file  . Ran Out of Food in the Last Year: Not on file  Transportation Needs:   . Lack of Transportation (Medical): Not on file  . Lack of Transportation (Non-Medical): Not on file  Physical Activity:   . Days of Exercise per  Week: Not on file  . Minutes of Exercise per Session: Not on file  Stress:   . Feeling of Stress : Not on file  Social Connections:   . Frequency of Communication with Friends and Family: Not on file  . Frequency of Social Gatherings with Friends and Family: Not on file  . Attends Religious Services: Not on file  . Active Member of Clubs or Organizations: Not on file  . Attends Banker Meetings: Not on file  . Marital Status: Not on file  Intimate Partner Violence:   . Fear of Current or Ex-Partner: Not on file  . Emotionally Abused: Not on file  . Physically Abused: Not on file  . Sexually Abused: Not on file    Review of Systems: ROS negative except for what is noted on the assessment and plan.  Vitals:   10/04/20 1312 10/04/20 1322  BP: (!) 157/92 (!) 146/97  Pulse: 99 98  Temp: 98.4 F (36.9 C)   TempSrc: Oral   SpO2: 100%   Weight: 237 lb 1.6 oz (107.5 kg)      Physical Exam: Physical Exam Constitutional:      Appearance: Normal appearance.  HENT:     Head: Normocephalic and atraumatic.  Eyes:     Extraocular Movements: Extraocular movements intact.  Cardiovascular:     Rate and Rhythm: Normal rate.     Pulses: Normal pulses.     Heart sounds: Normal heart sounds.  Pulmonary:     Effort: Pulmonary effort is normal.     Breath sounds: Normal breath sounds.  Abdominal:     General: Bowel sounds are normal.     Palpations: Abdomen is soft.     Tenderness: There is no abdominal tenderness.  Musculoskeletal:        General: Normal range of motion.     Cervical back: Normal range of motion.     Right lower leg: No edema.     Left lower leg: No edema.  Skin:    General: Skin is warm and dry.  Neurological:     Mental Status: He is alert and oriented to person, place, and time. Mental status is at baseline.  Psychiatric:        Mood and Affect: Mood normal.      Assessment & Plan:   See Encounters Tab for problem based charting.  Patient  discussed with Dr. Lupe Carney,  [facility], PGY-2 Pager: [phone], Phone: [phone] Date 10/05/2020 Time 8:08 AM

## 2020-10-04 NOTE — Patient Instructions (Signed)
Thank you, [name] for allowing [place] to provide your care today. Today we discussed sleep apnea, blood pressure, colonoscopy and vaccines .    I have ordered the following labs for you:   Lab Orders     BMP8+Anion Gap     POC Hbg A1C   I will call if any are abnormal. All of your labs can be accessed through "My Chart".  I have place a referrals to Gastroenterology for a colonscopy .  I have ordered the following tests: Colonoscopy    I have ordered the following medication/changed the following medications:  1. I have put in an order for a new CPAP machine for you.  2. Consider nicotine replacement and tobaccocessation medciations  3. Please get a blood pressure cuff to start taking your blood pressures at home.. We may need to start another medication in the near furture.  Please follow-up with Christopher Thompson in a couple of months. .  GOALS:   Should you have any questions or concerns please call the internal medicine clinic at [phone].    Christopher Thompson, D.O. [facility]

## 2020-10-05 ENCOUNTER — Encounter: Payer: Self-pay | Admitting: Internal Medicine

## 2020-10-05 ENCOUNTER — Telehealth: Payer: Self-pay | Admitting: Internal Medicine

## 2020-10-05 DIAGNOSIS — I10 Essential (primary) hypertension: Secondary | ICD-10-CM

## 2020-10-05 LAB — BMP8+ANION GAP
Anion Gap: 15 mmol/L (ref 10.0–18.0)
BUN/Creatinine Ratio: 12 (ref 10–24)
BUN: 11 mg/dL (ref 8–27)
CO2: 23 mmol/L (ref 20–29)
Calcium: 9.5 mg/dL (ref 8.6–10.2)
Chloride: 101 mmol/L (ref 96–106)
Creatinine, Ser: 0.89 mg/dL (ref 0.76–1.27)
GFR calc Af Amer: 104 mL/min/{1.73_m2} (ref >59)
GFR calc non Af Amer: 90 mL/min/{1.73_m2} (ref >59)
Glucose: 92 mg/dL (ref 65–99)
Potassium: 3.9 mmol/L (ref 3.5–5.2)
Sodium: 139 mmol/L (ref 134–144)

## 2020-10-05 MED ORDER — SPIRONOLACTONE 25 MG PO TABS: 25.0000 mg | ORAL_TABLET | Freq: Every day | ORAL | 2 refills | Status: DC

## 2020-10-05 NOTE — Telephone Encounter (Signed)
I spoke to the patient regarding his labs.  I counseled the patient starting spironolactone 25 mg daily.  He will need a 1 week follow-up to reassess his potassium and check his blood pressure, which can be a nurse visit.  Dellia Cloud, D.O. [facility], PGY-2 Pager: [phone], Phone: [phone] Date 10/05/2020 Time 4:26 PM

## 2020-10-05 NOTE — Assessment & Plan Note (Signed)
Performed on 6/22 showing moderate obstructive disease.  I will order a CPAP with titration today.

## 2020-10-05 NOTE — Assessment & Plan Note (Addendum)
Patient presents for her further evaluation and management of hypertension.  He is hypertensive today at 146/97 on .  Patient does not currently take his blood pressure at home but states that he will start doing that to keep an eye on his blood pressure.  He admits to walking 2 to 3 miles daily and has tried to work on his diet as a means of improving his hypertension.  Patient presents today for further evaluation and management of his HTN. His blood pressure is uncontrolled on amlodipine 10 mg hydrochlorothiazide 25 mg, losartan 100 mg, metoprolol 200 mg. He admits to being compliant with his antihypertensive medications. He  denies no side effects noted and Medication side effects..   Cardiovascular risk factors: advanced age (older than 62 for men, 4 for women), dyslipidemia, hypertension, male gender, obesity (BMI >= 30 kg/m2), smoking/ tobacco exposure and Sleep study positive for sleep apnea.. Use of agents associated with hypertension: none. History of target organ damage: Unknown. He is exercising daily and admits to walking 2 to 3 miles each day.  He admits to trying to work on his diet and tries to maintain a low-salt diet. He is not checking his blood pressure at home.   Current Blood pressure for today's visit is listed below:  Blood Pressure 10/04/2020 02/04/2020  BP 146/97 159/83  Some recent data might be hidden   Previous evaluation for secondary causes of his hypertension were negative for hyperaldosteronism related disease.  He is yet to have abdominal ultrasound looking for renal artery stenosis.  Plan: 1. Patient had a previous sleep study positive for sleep apnea but does not have a CPAP at home.  I counseled him on the importance of using his CPAP as it will likely improve his blood pressure patient agrees to using the CPAP at this time. 2.  I counseled the patient that he will more than likely need another antihypertensive medication added to his regimen.  I will get a BMP  today to assess his kidney function potassium.  He will likely need to start spironolactone if his potassium is well enough to tolerate it.   **ADDENDUM** - Potassium was 3.9 today. Therefore, I will initiate spironolactone 25 mg daily.  He will likely need close follow-up to reassess his electrolytes.

## 2020-10-05 NOTE — Assessment & Plan Note (Addendum)
Patient states that he has been on the fence about getting the Covid vaccine due to misinformation and social media.  After our discussion today, he admits to interest in getting the Covid vaccine.  He would like some additional resources regarding the vaccine that he can take on with him today.  Furthermore the patient states that he will get flu shot today and subsequently will be willing to get the pneumonia shot in the future.  Plan: -I provided the patient information on the Covid vaccine and gave him resources on where he can get the vaccine.

## 2020-10-05 NOTE — Assessment & Plan Note (Signed)
Patient states that he smokes 2-3 cigarettes /day for "his whole life" (unknown but at least > 30 pack-year history). He has been in the contemplation stage of quitting prior to today's appointment. He is not whiling to quit smoking in the next 30 days. He  believes his barriers to quitting are the following: under a lot of stress now.  Through shared decision-making we set a smoking/tobacco cessation date of 12/29/2020   Plan: I advised patient to quit, and offered support. Discussed current use pattern. Asked patient to inform me when they set a quit date. Counseled him on social support, nicotine replacement, and medications to help him with tobacco cessation.

## 2020-10-05 NOTE — Assessment & Plan Note (Signed)
Patient is currently taking rosuvastatin 20 mg daily for hyperlipidemia.  He does admit to some myalgias in his lower extremities bilaterally.  He states that these myalgias come and go and they are mild in nature.  I counseled that we will continue to keep an eye on these myalgias and adjust his medications as needed.

## 2020-10-05 NOTE — Assessment & Plan Note (Signed)
Patient had a previous FOBT that was positive on 05/09/2018.  He is yet to have a follow-up colonoscopy.  I will put in referral for gastroenterology so that he can have his colonoscopy done.

## 2020-10-05 NOTE — Progress Notes (Signed)
Internal Medicine Clinic Attending  Case discussed with Dr. Coe at the time of the visit.  We reviewed the resident's history and exam and pertinent patient test results.  I agree with the assessment, diagnosis, and plan of care documented in the resident's note.  Hibah Odonnell, M.D., Ph.D.  

## 2020-10-13 ENCOUNTER — Other Ambulatory Visit: Payer: Self-pay | Admitting: Internal Medicine

## 2020-10-13 DIAGNOSIS — J439 Emphysema, unspecified: Secondary | ICD-10-CM

## 2020-10-17 ENCOUNTER — Other Ambulatory Visit: Payer: Self-pay | Admitting: Internal Medicine

## 2020-10-17 DIAGNOSIS — J439 Emphysema, unspecified: Secondary | ICD-10-CM

## 2020-10-18 ENCOUNTER — Encounter: Payer: Self-pay | Admitting: *Deleted

## 2020-11-15 ENCOUNTER — Encounter: Payer: Self-pay | Admitting: *Deleted

## 2020-11-18 ENCOUNTER — Other Ambulatory Visit: Payer: Self-pay | Admitting: Internal Medicine

## 2020-11-18 DIAGNOSIS — J439 Emphysema, unspecified: Secondary | ICD-10-CM

## 2020-12-19 ENCOUNTER — Other Ambulatory Visit: Payer: Self-pay | Admitting: Internal Medicine

## 2020-12-19 DIAGNOSIS — I10 Essential (primary) hypertension: Secondary | ICD-10-CM

## 2020-12-21 DIAGNOSIS — Z23 Encounter for immunization: Secondary | ICD-10-CM | POA: Diagnosis not present

## 2020-12-31 ENCOUNTER — Other Ambulatory Visit: Payer: Self-pay | Admitting: Internal Medicine

## 2020-12-31 DIAGNOSIS — I10 Essential (primary) hypertension: Secondary | ICD-10-CM

## 2021-01-25 ENCOUNTER — Encounter: Payer: Self-pay | Admitting: *Deleted

## 2021-02-16 ENCOUNTER — Other Ambulatory Visit: Payer: Self-pay | Admitting: Internal Medicine

## 2021-02-16 DIAGNOSIS — I10 Essential (primary) hypertension: Secondary | ICD-10-CM

## 2021-02-22 NOTE — Addendum Note (Signed)
Addended by: Neomia Dear on: 02/22/2021 05:21 PM   Modules accepted: Orders

## 2021-03-04 ENCOUNTER — Other Ambulatory Visit: Payer: Self-pay | Admitting: Internal Medicine

## 2021-03-04 DIAGNOSIS — E785 Hyperlipidemia, unspecified: Secondary | ICD-10-CM

## 2021-03-09 ENCOUNTER — Encounter: Payer: Self-pay | Admitting: *Deleted

## 2021-03-09 NOTE — Progress Notes (Signed)
Things That May Be Affecting Your Health:  Alcohol  Hearing loss  Pain    Depression  Home Safety  Sexual Health   Diabetes  Lack of physical activity  Stress   Difficulty with daily activities  Loneliness  Tiredness   Drug use  Medicines x Tobacco use   Falls  Motor Vehicle Safety x Weight   Food choices  Oral Health  Other    YOUR PERSONALIZED HEALTH PLAN : 1. Schedule your next subsequent Medicare Wellness visit in one year 2. Attend all of your regular appointments to address your medical issues 3. Complete the preventative screenings and services   Annual Wellness Visit   Medicare Covered Preventative Screenings and Services  Services & Screenings Men and Women Who How Often Need? Date of Last Service Action  Abdominal Aortic Aneurysm Adults with AAA risk factors Once      Alcohol Misuse and Counseling All Adults Screening once a year if no alcohol misuse. Counseling up to 4 face to face sessions.     Bone Density Measurement  Adults at risk for osteoporosis Once every 2 yrs      Lipid Panel Z13.6 All adults without CV disease Once every 5 yrs       Colorectal Cancer   Stool sample or  Colonoscopy All adults 50 and older   Once every year  Every 10 years x  Positive FOBT 2019   Needs colonoscopy due to positive FIT test  Depression All Adults Once a year x Today   Diabetes Screening Blood glucose, post glucose load, or GTT Z13.1  All adults at risk  Pre-diabetics  Once per year  Twice per year x     Diabetes  Self-Management Training All adults Diabetics 10 hrs first year; 2 hours subsequent years. Requires Copay     Glaucoma  Diabetics  Family history of glaucoma  African Americans 50 yrs +  Hispanic Americans 65 yrs + Annually - requires coppay      Hepatitis C Z72.89 or F19.20  High Risk for HCV  Born between 1945 and 1965  Annually  Once      HIV Z11.4 All adults based on risk  Annually btw ages 77 & 74 regardless of risk  Annually >  65 yrs if at increased risk      Lung Cancer Screening Asymptomatic adults aged 66-77 with 30 pack yr history and current smoker OR quit within the last 15 yrs Annually Must have counseling and shared decision making documentation before first screen x     Medical Nutrition Therapy Adults with   Diabetes  Renal disease  Kidney transplant within past 3 yrs 3 hours first year; 2 hours subsequent years     Obesity and Counseling All adults Screening once a year Counseling if BMI 30 or higher  Today   Tobacco Use Counseling Adults who use tobacco  Up to 8 visits in one year x    Vaccines Z23  Hepatitis B  Influenza   Pneumonia  Adults   Once  Once every flu season  Two different vaccines separated by one year     Next Annual Wellness Visit People with Medicare Every year  Today     Services & Screenings Women Who How Often Need  Date of Last Service Action  Mammogram  Z12.31 Women over 40 One baseline ages 4-39. Annually ager 40 yrs+      Pap tests All women Annually if high risk. Every 2 yrs for  normal risk women      Screening for cervical cancer with   Pap (Z01.419 nl or Z01.411abnl) &  HPV Z11.51 Women aged 23 to 75 Once every 5 yrs     Screening pelvic and breast exams All women Annually if high risk. Every 2 yrs for normal risk women     Sexually Transmitted Diseases  Chlamydia  Gonorrhea  Syphilis All at risk adults Annually for non pregnant females at increased risk         Services & Screenings Men Who How Ofter Need  Date of Last Service Action  Prostate Cancer - DRE & PSA Men over 50 Annually.  DRE might require a copay.        Sexually Transmitted Diseases  Syphilis All at risk adults Annually for men at increased risk      Health Maintenance List Health Maintenance  Topic Date Due  . COLON CANCER SCREENING ANNUAL FOBT  05/10/2019  . COLONOSCOPY (Pts 45-38yrs Insurance coverage will need to be confirmed)  03/29/2020  . INFLUENZA  VACCINE  07/25/2020  . PNA vac Low Risk Adult (1 of 2 - PCV13) 12/25/2021 (Originally 07/18/2020)  . TETANUS/TDAP  03/18/2024  . COVID-19 Vaccine  Completed  . Hepatitis C Screening  Completed  . HIV Screening  Completed  . HPV VACCINES  Aged Out

## 2021-03-09 NOTE — Progress Notes (Signed)

## 2021-03-26 ENCOUNTER — Other Ambulatory Visit: Payer: Self-pay | Admitting: Internal Medicine

## 2021-03-26 DIAGNOSIS — J439 Emphysema, unspecified: Secondary | ICD-10-CM

## 2021-03-28 NOTE — Telephone Encounter (Signed)
Please schedule a BP follow appt with Red Team

## 2021-04-03 ENCOUNTER — Other Ambulatory Visit: Payer: Self-pay | Admitting: Internal Medicine

## 2021-04-03 DIAGNOSIS — I10 Essential (primary) hypertension: Secondary | ICD-10-CM

## 2021-04-20 ENCOUNTER — Ambulatory Visit (INDEPENDENT_AMBULATORY_CARE_PROVIDER_SITE_OTHER): Payer: Medicare HMO | Admitting: Student

## 2021-04-20 ENCOUNTER — Other Ambulatory Visit: Payer: Self-pay

## 2021-04-20 ENCOUNTER — Encounter: Payer: Self-pay | Admitting: Student

## 2021-04-20 VITALS — BP 154/96 | HR 93 | Temp 98.1°F | Ht 71.0 in | Wt 244.1 lb

## 2021-04-20 DIAGNOSIS — R69 Illness, unspecified: Secondary | ICD-10-CM | POA: Diagnosis not present

## 2021-04-20 DIAGNOSIS — R195 Other fecal abnormalities: Secondary | ICD-10-CM

## 2021-04-20 DIAGNOSIS — J439 Emphysema, unspecified: Secondary | ICD-10-CM

## 2021-04-20 DIAGNOSIS — F17200 Nicotine dependence, unspecified, uncomplicated: Secondary | ICD-10-CM | POA: Diagnosis not present

## 2021-04-20 DIAGNOSIS — G4730 Sleep apnea, unspecified: Secondary | ICD-10-CM

## 2021-04-20 DIAGNOSIS — I10 Essential (primary) hypertension: Secondary | ICD-10-CM

## 2021-04-20 MED ORDER — OLMESARTAN-AMLODIPINE-HCTZ 40-10-25 MG PO TABS: 1.0000 | ORAL_TABLET | Freq: Every day | ORAL | 3 refills | Status: DC

## 2021-04-20 NOTE — Patient Instructions (Signed)
Thank you, Mr.Alter D Osterman for allowing [place] to provide your care today. Today we discussed your blood pressure, smoking and colon cancer screening.    I have ordered the following labs for you:  Lab Orders  No laboratory test(s) ordered today     I will call if any are abnormal. All of your labs can be accessed through "My Chart".  I have reordered your CPAP machine  I have ordered the following medication/changed the following medications:  1. Start taking Olmesartan-Amlodipine-HCTZ 40-10-25 mg daily 2. Stop taking Amlodipine, Losartan and HCTZ  Please follow-up in 3 months  Should you have any questions or concerns please call the internal medicine clinic at [phone].    Sharrell Ku, MD, MPH [facility]   My Chart Access: [email or web address]   If you have not already done so, please get your COVID 19 vaccine  To schedule an appointment for a COVID vaccine choice any of the following: Go to [email or web address]   Go to [email or web address]                  Call [phone]                                     Call [phone] and select Option 2

## 2021-04-20 NOTE — Progress Notes (Signed)
   CC: Follow up  HPI:  Christopher Thompson is a 65 y.o. M with PMH as below who presents to clinic with a follow up on his chronic medical problems. Please see problem based charting for evaluation, assessment and plan.  Past Medical History:  Diagnosis Date  . COPD (chronic obstructive pulmonary disease) (HCC) 10/01/2006  . Drug-induced hepatic toxicity 06/21/2007  . Eczema 10/01/2006  . Hyperlipidemia 10/01/2006  . Hypertension 10/01/2006  . Learning disability 01/07/2007   Determined in court for disability  . Sleep apnea 10/01/2006   mild to moderate  . Tobacco abuse 01/07/2007    Review of Systems:  Constitutional: Negative for fever or fatigue Eyes: Negative for visual changes Respiratory: Negative for shortness of breath Cardiac: Negative for chest pain MSK: Negative for back pain Abdomen: Negative for abdominal pain, constipation or diarrhea Neuro: Negative for headache or weakness  Physical Exam: General: Pleasant, obese elderly male. No acute distress. Cardiac: Mild tachycardia. Regular rhythm. No m/r/g. No LE edema Respiratory: Lungs CTAB. No wheezing or crackles. Abdominal: Soft, symmetric and non tender. Normal BS. Skin: Warm, dry and intact without rashes or lesions Extremities: Atraumatic. Full ROM. Pulse palpable. Neuro: A&O x 3. Moves all extremities.  Vitals:   04/20/21 1322  BP: (!) 154/96  Pulse: 93  Temp: 98.1 F (36.7 C)  TempSrc: Oral  SpO2: 99%  Weight: 244 lb 1.6 oz (110.7 kg)  Height: 5\' 11"  (1.803 m)     Assessment & Plan:   See Encounters Tab for problem based charting.  Patient discussed with Dr. , MD, MPH

## 2021-04-21 ENCOUNTER — Encounter: Payer: Self-pay | Admitting: Student

## 2021-04-21 MED ORDER — BLOOD PRESSURE KIT DEVI: 1.0000 [IU] | Freq: Every day | 0 refills | Status: AC

## 2021-04-21 NOTE — Assessment & Plan Note (Addendum)
Vitals:   04/20/21 1322  BP: (!) 154/96   Patient's BP still elevated today. Patient states he was sent a medication after his last visit but he did not take it as he did not know what it was for. Chart review shows that spironolactone was added to patient's regimen after BMP showed normal potassium. Patient is 5 different antihypertensive agents so will start a combo pill to reduce pill burden and avoid confusion. Patient denies any headaches, SOB, CP or visual changes. Patient states he is working on his diet but has gained 6 lbs in the last 6 months. His hypertension will likely improve once patient is able to get his CPAP.   Plan: Start Tribenzor (Olmesartan-amlodipine-HCTZ) 40-10-25 mg daily to replace losartan, amlodipine and HCTZ --Continue metoprolol 200 mg daily --Continue spironolactone 25 mg daily --CPAP ordered for his moderate OSA --Ordered BP machine for patient to check BP daily at home --BMP check at next office visit to monitor kidney function and K+.

## 2021-04-21 NOTE — Assessment & Plan Note (Signed)
Stable. He denies any shortness of breath or wheezing. No wheezing on exam. Patient was educated on the appropriate use of his inhalers as he was using his rescue inhaler daily instead of the Advair Diskus.   Plan: --Re-emphasized the importance of using the Advair Diskus, 2 puffs twice daily and Atrovent inhaler on as needed basis or shortness of breath or wheezing.

## 2021-04-21 NOTE — Assessment & Plan Note (Signed)
Patient states he still have not done his colonoscopy and plans to get it down in the summer after his birthday.  --Send another referral to GI for colonoscopy at next visit.

## 2021-04-21 NOTE — Assessment & Plan Note (Signed)
Patient with hx of moderate OSA not currently on CPAP. Chart review shows that CPAP was ordered during his last office visit 6 months ago but patient did not receive it.   Plan: --Re-order CPAP with autotitration

## 2021-04-21 NOTE — Assessment & Plan Note (Signed)
Patient states he is working on quitting and now down to 2 cigarettes a day. He does not want any smoking cessation assistance at the moment.  --Continue to monitor and assess readiness to quit at each visit.

## 2021-04-22 NOTE — Progress Notes (Signed)
Internal Medicine Clinic Attending  Case discussed with Dr. Amponsah  At the time of the visit.  We reviewed the resident's history and exam and pertinent patient test results.  I agree with the assessment, diagnosis, and plan of care documented in the resident's note.  

## 2021-04-23 ENCOUNTER — Other Ambulatory Visit: Payer: Self-pay | Admitting: Internal Medicine

## 2021-04-23 DIAGNOSIS — J439 Emphysema, unspecified: Secondary | ICD-10-CM

## 2021-05-23 ENCOUNTER — Other Ambulatory Visit: Payer: Self-pay | Admitting: Internal Medicine

## 2021-06-09 ENCOUNTER — Encounter: Payer: Self-pay | Admitting: *Deleted

## 2021-08-27 ENCOUNTER — Other Ambulatory Visit: Payer: Self-pay | Admitting: Student

## 2021-08-27 DIAGNOSIS — J439 Emphysema, unspecified: Secondary | ICD-10-CM

## 2021-08-31 NOTE — Telephone Encounter (Signed)
VENTOLIN HFA 108 (90 Base) MCG/ACT inhaler, REFILL REQUEST @ [id number] - [place], [place] - [address] AT [address] OF [address].

## 2021-09-01 MED ORDER — VENTOLIN HFA 108 (90 BASE) MCG/ACT IN AERS: 1.0000 | INHALATION_SPRAY | Freq: Four times a day (QID) | RESPIRATORY_TRACT | 5 refills | Status: DC | PRN

## 2021-09-11 ENCOUNTER — Other Ambulatory Visit: Payer: Self-pay | Admitting: Internal Medicine

## 2021-09-11 DIAGNOSIS — J439 Emphysema, unspecified: Secondary | ICD-10-CM

## 2021-09-12 ENCOUNTER — Other Ambulatory Visit: Payer: Self-pay | Admitting: Internal Medicine

## 2021-09-12 DIAGNOSIS — I10 Essential (primary) hypertension: Secondary | ICD-10-CM

## 2021-09-22 ENCOUNTER — Ambulatory Visit (INDEPENDENT_AMBULATORY_CARE_PROVIDER_SITE_OTHER): Payer: Medicare HMO | Admitting: Internal Medicine

## 2021-09-22 ENCOUNTER — Other Ambulatory Visit: Payer: Self-pay

## 2021-09-22 ENCOUNTER — Encounter: Payer: Self-pay | Admitting: Internal Medicine

## 2021-09-22 VITALS — BP 136/66 | HR 84 | Temp 98.0°F | Wt 245.9 lb

## 2021-09-22 DIAGNOSIS — E785 Hyperlipidemia, unspecified: Secondary | ICD-10-CM | POA: Diagnosis not present

## 2021-09-22 DIAGNOSIS — I10 Essential (primary) hypertension: Secondary | ICD-10-CM

## 2021-09-22 DIAGNOSIS — R739 Hyperglycemia, unspecified: Secondary | ICD-10-CM | POA: Diagnosis not present

## 2021-09-22 DIAGNOSIS — Z23 Encounter for immunization: Secondary | ICD-10-CM | POA: Diagnosis not present

## 2021-09-22 DIAGNOSIS — J439 Emphysema, unspecified: Secondary | ICD-10-CM | POA: Diagnosis not present

## 2021-09-22 DIAGNOSIS — Z0000 Encounter for general adult medical examination without abnormal findings: Secondary | ICD-10-CM

## 2021-09-22 DIAGNOSIS — G4730 Sleep apnea, unspecified: Secondary | ICD-10-CM

## 2021-09-22 LAB — POCT GLYCOSYLATED HEMOGLOBIN (HGB A1C): Hemoglobin A1C: 5.9 % — AB (ref 4.0–5.6)

## 2021-09-22 NOTE — Patient Instructions (Addendum)
[name], it was a pleasure seeing you today! You endorsed feeling well today. Below are some of the things we talked about this visit. We look forward to seeing you in the follow up appointment!  Today we discussed: Your blood pressure was well controlled. Please continue taking your medications. Please obtain a monitor and log your blood pressure at home. Use the round inhaler every day and the other one as needed. We will check labs today to see how your kidneys and your cholesterol is doing. You agreed to a flu shot today. Please come back in 3 months for follow up and colonoscopy referral.   I have ordered the following labs today:  Lab Orders         BMP8+Anion Gap         Lipid Profile         POC Hbg A1C        Referrals ordered today:   Referral Orders  No referral(s) requested today     I have ordered the following medication/changed the following medications:   Stop the following medications: There are no discontinued medications.   Start the following medications: No orders of the defined types were placed in this encounter.    Follow-up: 3 month follow up   Please make sure to arrive 15 minutes prior to your next appointment. If you arrive late, you may be asked to reschedule.   We look forward to seeing you next time. Please call our clinic at [phone] if you have any questions or concerns. The best time to call is Monday-Friday from 9am-4pm, but there is someone available 24/7. If after hours or the weekend, call the main hospital number and ask for the Internal Medicine Resident On-Call. If you need medication refills, please notify your pharmacy one week in advance and they will send [place] a request.  Thank you for letting us take part in your care. Wishing you the best!  Thank you, Gwenevere Abbot, MD

## 2021-09-22 NOTE — Progress Notes (Signed)
   CC: follow up   HPI:  [name] is a 65 y.o. with medical history as below presenting to [facility] for follow up.  Please see problem-based list for further details, assessments, and plans.  Past Medical History:  Diagnosis Date   COPD (chronic obstructive pulmonary disease) (HCC) 10/01/2006   Drug-induced hepatic toxicity 06/21/2007   Eczema 10/01/2006   Hyperlipidemia 10/01/2006   Hypertension 10/01/2006   Learning disability 01/07/2007   Determined in court for disability   Sleep apnea 10/01/2006   mild to moderate   Tobacco abuse 01/07/2007   Review of Systems:  Review of system negative unless stated in the problem list or HPI.    Physical Exam:  Vitals:   09/22/21 1315  BP: 136/66  Pulse: 84  Temp: 98 F (36.7 C)  TempSrc: Oral  SpO2: 100%  Weight: 245 lb 14.4 oz (111.5 kg)    Physical Exam Constitutional:      General: He is not in acute distress.    Appearance: Normal appearance. He is not ill-appearing.  HENT:     Head: Normocephalic and atraumatic.     Right Ear: External ear normal.     Left Ear: External ear normal.     Nose: Nose normal.     Mouth/Throat:     Mouth: Mucous membranes are moist.     Pharynx: Oropharynx is clear. No oropharyngeal exudate or posterior oropharyngeal erythema.  Eyes:     Extraocular Movements: Extraocular movements intact.     Conjunctiva/sclera: Conjunctivae normal.     Pupils: Pupils are equal, round, and reactive to light.  Cardiovascular:     Rate and Rhythm: Normal rate and regular rhythm.     Pulses: Normal pulses.     Heart sounds: Normal heart sounds. No murmur heard.   No friction rub.  Pulmonary:     Effort: Pulmonary effort is normal.     Breath sounds: Normal breath sounds.  Abdominal:     General: Bowel sounds are normal.     Palpations: Abdomen is soft.  Musculoskeletal:        General: No swelling, tenderness, deformity or signs of injury. Normal range of motion.     Cervical back: Normal  range of motion and neck supple.     Right lower leg: No edema.     Left lower leg: No edema.  Skin:    Capillary Refill: Capillary refill takes less than 2 seconds.     Coloration: Skin is not jaundiced or pale.     Findings: No erythema, lesion or rash.  Neurological:     General: No focal deficit present.     Mental Status: He is alert and oriented to person, place, and time. Mental status is at baseline.  Psychiatric:        Mood and Affect: Mood normal.        Behavior: Behavior normal.    Assessment & Plan:   See Encounters Tab for problem based charting.  Patient seen with Dr. Mauri Pole, MD

## 2021-09-23 LAB — BMP8+ANION GAP
Anion Gap: 14 mmol/L (ref 10.0–18.0)
BUN/Creatinine Ratio: 11 (ref 10–24)
BUN: 12 mg/dL (ref 8–27)
CO2: 24 mmol/L (ref 20–29)
Calcium: 9.3 mg/dL (ref 8.6–10.2)
Chloride: 105 mmol/L (ref 96–106)
Creatinine, Ser: 1.11 mg/dL (ref 0.76–1.27)
Glucose: 92 mg/dL (ref 70–99)
Potassium: 3.6 mmol/L (ref 3.5–5.2)
Sodium: 143 mmol/L (ref 134–144)
eGFR: 73 mL/min/{1.73_m2} (ref >59)

## 2021-09-23 LAB — LIPID PANEL
Chol/HDL Ratio: 3.1 ratio (ref 0.0–5.0)
Cholesterol, Total: 122 mg/dL (ref 100–199)
HDL: 39 mg/dL — ABNORMAL LOW (ref >39)
LDL Chol Calc (NIH): 64 mg/dL (ref 0–99)
Triglycerides: 101 mg/dL (ref 0–149)
VLDL Cholesterol Cal: 19 mg/dL (ref 5–40)

## 2021-09-25 MED ORDER — OLMESARTAN-AMLODIPINE-HCTZ 40-10-25 MG PO TABS: 1.0000 | ORAL_TABLET | Freq: Every day | ORAL | 3 refills | Status: DC

## 2021-09-25 MED ORDER — FLUTICASONE-SALMETEROL 250-50 MCG/ACT IN AEPB: 1.0000 | INHALATION_SPRAY | Freq: Two times a day (BID) | RESPIRATORY_TRACT | 11 refills | Status: DC

## 2021-09-25 MED ORDER — ROSUVASTATIN CALCIUM 20 MG PO TABS: 20.0000 mg | ORAL_TABLET | Freq: Every day | ORAL | 3 refills | Status: DC

## 2021-09-25 MED ORDER — METOPROLOL SUCCINATE ER 200 MG PO TB24: 200.0000 mg | ORAL_TABLET | Freq: Every day | ORAL | 3 refills | Status: DC

## 2021-09-25 MED ORDER — SPIRONOLACTONE 25 MG PO TABS: 25.0000 mg | ORAL_TABLET | Freq: Every day | ORAL | 3 refills | Status: DC

## 2021-09-25 NOTE — Assessment & Plan Note (Signed)
Assessment: Patient has COPD and is on Adviar and atrovent, and ventolin inhaler. He was using Atrovent daily and Adviar on as needed basis. Re-emphasized correct way to take his inhalers with Advair daily and others as needed. Lungs were CTAB.   Plan: -Continue Adviar twice daily -Atrovent and Ventolin as needed

## 2021-09-25 NOTE — Assessment & Plan Note (Signed)
Assessment: Patient has history of hypertension well controlled with Toprol 200 mg, Tribenzor 40-10-25 mg daily and Spironolactone 25 mg. Patient does not check his blood pressure at home. I advised him to check his bp at home and bring a log next time.   Plan: -Continue current meds -BMP this visit  Addendum: BMP wnl

## 2021-09-25 NOTE — Assessment & Plan Note (Signed)
Flu shot given this visit. 

## 2021-09-25 NOTE — Assessment & Plan Note (Signed)
Patient still has not heard regarding CPAP. Spoke with nursing staff to inquire about CPAP order.

## 2021-09-25 NOTE — Assessment & Plan Note (Signed)
Assessment: Patient's has hyperlipidemia that is controlled. Current medications include Crestor 20 mg. Patient endorsed adherence.   Plan: -Continue current medications  -Continue to make lifestyle modifications. -Lipid panel this visit  Addendum: Cholesterol and LDL wnl Lipid Panel     Component Value Date/Time   CHOL 122 09/22/2021 1410   TRIG 101 09/22/2021 1410   HDL 39 (L) 09/22/2021 1410   CHOLHDL 3.1 09/22/2021 1410   CHOLHDL 4.1 09/23/2014 1403   VLDL 30 09/23/2014 1403   LDLCALC 64 09/22/2021 1410   LABVLDL 19 09/22/2021 1410

## 2021-09-29 ENCOUNTER — Telehealth: Payer: Self-pay | Admitting: *Deleted

## 2021-09-29 NOTE — Telephone Encounter (Signed)
New, Deveron Furlong, [place], [place]; Mucarabones, [place]; [place], [place]; Criss Alvine, [name], CMA; Leward Quan Nickola Major, RN  Received, Thank you!    ----- Message -----  From: Rudene Re, [place]  Sent: 09/27/2021   1:46 PM EDT  To: Henderson Newcomer, Jone Baseman, *  Subject: Cpap order                                     Good afternoon a C-pap order has been placed for this patient.Thank you

## 2021-09-30 NOTE — Progress Notes (Signed)
Internal Medicine Clinic Attending  I saw and evaluated the patient.  I personally confirmed the key portions of the history and exam documented by Dr. Khan and I reviewed pertinent patient test results.  The assessment, diagnosis, and plan were formulated together and I agree with the documentation in the resident's note.  

## 2022-01-26 ENCOUNTER — Other Ambulatory Visit: Payer: Self-pay | Admitting: Internal Medicine

## 2022-01-26 DIAGNOSIS — J439 Emphysema, unspecified: Secondary | ICD-10-CM

## 2022-01-31 ENCOUNTER — Encounter: Payer: Self-pay | Admitting: Student

## 2022-01-31 ENCOUNTER — Ambulatory Visit (INDEPENDENT_AMBULATORY_CARE_PROVIDER_SITE_OTHER): Payer: Medicare Other | Admitting: Student

## 2022-01-31 ENCOUNTER — Other Ambulatory Visit: Payer: Self-pay

## 2022-01-31 VITALS — BP 111/60 | HR 97 | Temp 98.3°F | Ht 71.0 in | Wt 239.6 lb

## 2022-01-31 DIAGNOSIS — F17200 Nicotine dependence, unspecified, uncomplicated: Secondary | ICD-10-CM

## 2022-01-31 DIAGNOSIS — Z23 Encounter for immunization: Secondary | ICD-10-CM | POA: Diagnosis not present

## 2022-01-31 DIAGNOSIS — Z1211 Encounter for screening for malignant neoplasm of colon: Secondary | ICD-10-CM | POA: Diagnosis not present

## 2022-01-31 DIAGNOSIS — J439 Emphysema, unspecified: Secondary | ICD-10-CM

## 2022-01-31 DIAGNOSIS — Z720 Tobacco use: Secondary | ICD-10-CM | POA: Diagnosis not present

## 2022-01-31 DIAGNOSIS — I10 Essential (primary) hypertension: Secondary | ICD-10-CM | POA: Diagnosis not present

## 2022-01-31 DIAGNOSIS — Z0000 Encounter for general adult medical examination without abnormal findings: Secondary | ICD-10-CM

## 2022-01-31 MED ORDER — ATROVENT HFA 17 MCG/ACT IN AERS: 2.0000 | INHALATION_SPRAY | Freq: Four times a day (QID) | RESPIRATORY_TRACT | 5 refills | Status: DC | PRN

## 2022-01-31 NOTE — Assessment & Plan Note (Signed)
BP very well controlled on current regimen.  Denies any headaches, dizziness or blurry vision.  Reports she has lost a few pounds as well. -- Continue Tribenzor, Toprol Xl and spironolactone -- BP and BMP check at next office visit

## 2022-01-31 NOTE — Progress Notes (Signed)
° °  CC: Follow-up  HPI:  [name] is a 66 y.o. male with PMH as below who presents to clinic for follow-up on his chronic medical problems. Please see problem based charting for evaluation, assessment and plan.  Past Medical History:  Diagnosis Date   COPD (chronic obstructive pulmonary disease) (HCC) 10/01/2006   Drug-induced hepatic toxicity 06/21/2007   Eczema 10/01/2006   Hyperlipidemia 10/01/2006   Hypertension 10/01/2006   Learning disability 01/07/2007   Determined in court for disability   Sleep apnea 10/01/2006   mild to moderate   Tobacco abuse 01/07/2007    Review of Systems:  Constitutional: Negative for fever or fatigue Eyes: Negative for visual changes Respiratory: Negative for shortness of breath Cardiac: Negative for chest pain Neuro: Negative for headache or weakness  Physical Exam: General: Pleasant, well-appearing elderly male. No acute distress. Cardiac: RRR. No murmurs, rubs or gallops. No LE edema Respiratory: Lungs CTAB. No wheezing or crackles. Abdominal: Soft, symmetric and non tender. Normal BS. Skin: Warm, dry and intact without rashes or lesions Extremities: Atraumatic. Full ROM. Palpable radial and DP pulses. Neuro: A&O x 3. Moves all extremities Psych: Appropriate mood and affect.  Vitals:   01/31/22 1400  BP: 111/60  Pulse: 97  Temp: 98.3 F (36.8 C)  TempSrc: Oral  SpO2: 100%  Weight: 239 lb 9.6 oz (108.7 kg)  Height: 5\' 11"  (1.803 m)    Assessment & Plan:   See Encounters Tab for problem based charting.  Patient discussed with Dr. , MD, MPH

## 2022-01-31 NOTE — Assessment & Plan Note (Signed)
This has been stable. He denies any wheezing or shortness of breath.  Patient counseled on importance of smoking cessation in order to decrease his risk of lung cancer and worsening of his emphysema. -- Continue Advair twice daily and as needed Atrovent and Ventolin -- Continue to encourage smoking cessation

## 2022-01-31 NOTE — Assessment & Plan Note (Signed)
Patient reports he continues to smoke but down to a pack of cigarettes per week. He has been able to stop for 2 to 3 days but has not been able to do it for longer period of time. Patient reports smoking is not due to stressors with just a habit. Patient informed of available resources we have such as medications and nicotine replacement therapy to help him quit. States he plans to quit on his own but will accept help if unable to do so by next office visit. -- Follow-up on smoking cessation at next office visit -- For medication (Chantix) and NRT at next office visit if agreeable

## 2022-01-31 NOTE — Assessment & Plan Note (Signed)
Patient reports that his insurance is working with him and is okay with CPAP.  He just changed insurance to [place] [employer] and they plan on getting him a CPAP next few weeks. -- Follow-up at next OV to ensure patient has CPAP

## 2022-01-31 NOTE — Patient Instructions (Signed)
Thank you, [name] for allowing [place] to provide your care today. Today, we discussed your blood pressure, COPD, sleep apnea and colonoscopy screening. Your blood pressure is very well controlled. Continue taking all your medications as prescribed. You received the pneumonia shot today. It is very important to continue cutting down his cigarette use as this would decrease your risk of lung cancer.  I have place a referral to GI (stomach doctors) for you to get a colonoscopy.  We will call you to schedule this later in March or in April.  My Chart Access: [email or web address]  Please follow-up in 3 months  Please make sure to arrive 15 minutes prior to your next appointment. If you arrive late, you may be asked to reschedule.    We look forward to seeing you next time. Please call our clinic at [phone] if you have any questions or concerns. The best time to call is Monday-Friday from 9am-4pm, but there is someone available 24/7. If after hours or the weekend, call the main hospital number and ask for the Internal Medicine Resident On-Call. If you need medication refills, please notify your pharmacy one week in advance and they will send [place] a request.   Thank you for letting us take part in your care. Wishing you the best!  Steffanie Rainwater, MD 01/31/2022, 2:28 PM IM Resident, PGY-2 Duwayne Heck 41:10

## 2022-01-31 NOTE — Assessment & Plan Note (Addendum)
Received pneumonia vaccine today.  Referred to GI for colonoscopy.  

## 2022-02-01 NOTE — Progress Notes (Signed)
Internal Medicine Clinic Attending  Case discussed with Dr. Amponsah  At the time of the visit.  We reviewed the resident's history and exam and pertinent patient test results.  I agree with the assessment, diagnosis, and plan of care documented in the resident's note.  

## 2022-03-22 ENCOUNTER — Other Ambulatory Visit: Payer: Self-pay | Admitting: Internal Medicine

## 2022-03-22 DIAGNOSIS — J439 Emphysema, unspecified: Secondary | ICD-10-CM

## 2022-06-15 ENCOUNTER — Other Ambulatory Visit: Payer: Self-pay

## 2022-06-15 ENCOUNTER — Encounter: Payer: Self-pay | Admitting: Internal Medicine

## 2022-06-15 ENCOUNTER — Ambulatory Visit (INDEPENDENT_AMBULATORY_CARE_PROVIDER_SITE_OTHER): Payer: Medicare Other | Admitting: Internal Medicine

## 2022-06-15 VITALS — BP 120/83 | HR 81 | Temp 98.4°F | Resp 24 | Ht 71.0 in | Wt 226.5 lb

## 2022-06-15 DIAGNOSIS — F17210 Nicotine dependence, cigarettes, uncomplicated: Secondary | ICD-10-CM | POA: Diagnosis not present

## 2022-06-15 DIAGNOSIS — J439 Emphysema, unspecified: Secondary | ICD-10-CM

## 2022-06-15 DIAGNOSIS — E785 Hyperlipidemia, unspecified: Secondary | ICD-10-CM

## 2022-06-15 DIAGNOSIS — I10 Essential (primary) hypertension: Secondary | ICD-10-CM | POA: Diagnosis not present

## 2022-06-15 DIAGNOSIS — G4730 Sleep apnea, unspecified: Secondary | ICD-10-CM

## 2022-06-15 DIAGNOSIS — Z0000 Encounter for general adult medical examination without abnormal findings: Secondary | ICD-10-CM

## 2022-06-15 DIAGNOSIS — F17200 Nicotine dependence, unspecified, uncomplicated: Secondary | ICD-10-CM

## 2022-06-15 DIAGNOSIS — R195 Other fecal abnormalities: Secondary | ICD-10-CM | POA: Diagnosis not present

## 2022-06-15 DIAGNOSIS — Z1211 Encounter for screening for malignant neoplasm of colon: Secondary | ICD-10-CM

## 2022-06-15 NOTE — Patient Instructions (Signed)
Christopher Thompson, it was a pleasure seeing you today! You endorsed feeling well today. Below are some of the things we talked about this visit. We look forward to seeing you in the follow up appointment!  Today we discussed: You presented for a follow-up today.  You do not have any acute complaints.  Your blood pressure is great so continue taking your blood pressure medications.  Continue taking your cholesterol medicine and using your inhalers.  Use your 1 puff Advair inhaler 2 times a day and the other is as needed. I will look into the sleep study for you as it has been 2 years since her last sleep study and has lost 18 pounds. It is reported that you have a colonoscopy done and you said you are working on that.   It was a pleasure taking care of you and we look forward to seeing you again!  I have ordered the following labs today:  Lab Orders  No laboratory test(s) ordered today      Referrals ordered today:   Referral Orders  No referral(s) requested today     I have ordered the following medication/changed the following medications:   Stop the following medications: There are no discontinued medications.   Start the following medications: No orders of the defined types were placed in this encounter.    Follow-up: 3 month follow up  Please make sure to arrive 15 minutes prior to your next appointment. If you arrive late, you may be asked to reschedule.   We look forward to seeing you next time. Please call our clinic at [phone] if you have any questions or concerns. The best time to call is Monday-Friday from 9am-4pm, but there is someone available 24/7. If after hours or the weekend, call the main hospital number and ask for the Internal Medicine Resident On-Call. If you need medication refills, please notify your pharmacy one week in advance and they will send [place] a request.  Thank you for letting us take part in your care. Wishing you the best!  Thank you, Gwenevere Abbot, MD

## 2022-06-15 NOTE — Progress Notes (Unsigned)
CC: follow up  HPI:  [name] is a 66 y.o. with medical history as below presenting to [facility] for follow up.  Please see problem-based list for further details, assessments, and plans.  Past Medical History:  Diagnosis Date   COPD (chronic obstructive pulmonary disease) (HCC) 10/01/2006   Drug-induced hepatic toxicity 06/21/2007   Eczema 10/01/2006   Hyperlipidemia 10/01/2006   Hypertension 10/01/2006   Learning disability 01/07/2007   Determined in court for disability   Sleep apnea 10/01/2006   mild to moderate   Tobacco abuse 01/07/2007     Current Outpatient Medications (Cardiovascular):    metoprolol (TOPROL-XL) 200 MG 24 hr tablet, Take 1 tablet (200 mg total) by mouth daily. Take with or immediately following a meal. For high blood pressure.   Olmesartan-amLODIPine-HCTZ 40-10-25 MG TABS, Take 1 tablet by mouth daily. For high blood pressure   rosuvastatin (CRESTOR) 20 MG tablet, Take 1 tablet (20 mg total) by mouth daily. For high cholesterol   spironolactone (ALDACTONE) 25 MG tablet, Take 1 tablet (25 mg total) by mouth daily. For high blood pressure  Current Outpatient Medications (Respiratory):    albuterol (VENTOLIN HFA) 108 (90 Base) MCG/ACT inhaler, INHALE 1-2 PUFFS BY MOUTH EVERY 6 HOURS AS NEEDED FOR WHEEZE OR SHORTNESS OF BREATH   fluticasone-salmeterol (ADVAIR DISKUS) 250-50 MCG/ACT AEPB, Inhale 1 puff into the lungs in the morning and at bedtime. For breathing. Use EVERY DAY.   ipratropium (ATROVENT HFA) 17 MCG/ACT inhaler, Inhale 2 puffs into the lungs every 6 (six) hours as needed.    Current Outpatient Medications (Other):    Blood Pressure Monitoring (BLOOD PRESSURE KIT) DEVI, 1 Units by Does not apply route daily.  Review of Systems:  Review of system negative unless stated in the problem list or HPI.    Physical Exam:  Vitals:   06/15/22 1357  BP: 120/83  Pulse: 81  Resp: (!) 24  Temp: 98.4 F (36.9 C)  TempSrc: Oral  SpO2: 100%   Weight: 226 lb 8 oz (102.7 kg)  Height: 5\' 11"  (1.803 m)    Physical Exam General: NAD HENT: NCAT Lungs: CTAB, no wheeze, rhonchi or rales.  Cardiovascular: Normal heart sounds, no r/m/g, 2+ pulses in all extremities. No LE edema Abdomen: No TTP, normal bowel sounds MSK: No asymmetry or muscle atrophy.  Skin: no lesions noted on exposed skin Neuro: Alert and oriented x4. CN grossly intact Psych: Normal mood and normal affect   Assessment & Plan:   See Encounters Tab for problem based charting. PMHx of HTN, HLD, COPD, prediabetes  Patient discussed with Dr. , MD   Assessment/Plan BP at goal. BP in clinic today 120/83, HR 84.  Does not monitor BP level at home; educated on the importance of it. Home medications include Toprol 200 mg qd, Olmesartan-amlodipine-HCTZ 40-10-25 mg, spirinolactone 25 mg qd.  Reports good medication compliance. Tolerating medication without adverse effects. Denies headaches, vision changes, lightheadedness, chest pain, SHOB, leg swelling or changes in speech. Exam benign and vitals otherwise wnl. Last creatinine was 1.11 in 08/2021. Counseled on the importance of daily exercise, low salt diet, and weight loss. Continue current meds.  BP log and bring to next visit  Continue lifestyle changes F/u on BMP    COPD On advair 1 puff BID, albuterol prn , and flovent. Using prn sparingly.   Assessment/Plan Patient has HLD with goal of primaryprevention. Last lipid panel on 08/2021 showed Chol 122, LDL 64 . Reports compliance to  Crestor 20 mg without adverse effects. Counseled on the importance of continued lifestyle modifications, including: weight loss, daily exercise, and healthy diet with limited processed and fatty foods.  Continue Crestor 20 mg qd Lipid panel ordered   OSA Started using girlfriend's machine. 05/2020 last sleep study in [facility] 23.9/h. 18 lb weight loss since then.

## 2022-06-16 MED ORDER — NICOTINE 21 MG/24HR TD PT24: 21.0000 mg | MEDICATED_PATCH | TRANSDERMAL | 2 refills | Status: AC

## 2022-06-16 NOTE — Assessment & Plan Note (Signed)
Encouraged cessation. Patient interested in nicotine patches so I will send it to the pharmacy.

## 2022-06-16 NOTE — Assessment & Plan Note (Signed)
Referral to GI placed this visit for colonoscopy. Advised him to get shingles vaccine.

## 2022-06-16 NOTE — Assessment & Plan Note (Addendum)
BP at goal. BP in clinic today 120/83, HR 84.  Does not monitor BP level at home; educated on the importance of it. Home medications include Toprol 200 mg qd, Olmesartan-amlodipine-HCTZ 40-10-25 mg, spirinolactone 25 mg qd.  Reports good medication compliance. Tolerating medication without adverse effects. Denies headaches, vision changes, lightheadedness, chest pain, SHOB, leg swelling or changes in speech. Exam benign and vitals otherwise wnl. Last creatinine was 1.11 in 08/2021. Counseled on the importance of daily exercise, low salt diet, and weight loss along with getting CPAP for his OSA (see OSA). -Continue current meds  -Bring BP log to next visit (we may be able to reduce then remove toprol during subsequent visit as patient does not have CAD or any other indication for it.)  -Continue lifestyle changes -BMP next visit

## 2022-06-16 NOTE — Assessment & Plan Note (Signed)
Patient has COPD that is controlled. He is using advair daily. He does use his albuterol and flovent inhaler few times a week. He does not use Advair 2x every day but most days and advised him to use it twice daily everyday as that would minimize his prn usage.  -Continue current inhaler but with more adherence.

## 2022-06-19 NOTE — Progress Notes (Signed)
Internal Medicine Clinic Attending  Case discussed with Dr. Khan  At the time of the visit.  We reviewed the resident's history and exam and pertinent patient test results.  I agree with the assessment, diagnosis, and plan of care documented in the resident's note.  

## 2022-08-23 ENCOUNTER — Other Ambulatory Visit: Payer: Self-pay | Admitting: Internal Medicine

## 2022-08-23 DIAGNOSIS — J439 Emphysema, unspecified: Secondary | ICD-10-CM

## 2022-09-05 ENCOUNTER — Encounter: Payer: Medicare Other | Admitting: Internal Medicine

## 2022-09-13 ENCOUNTER — Other Ambulatory Visit: Payer: Self-pay | Admitting: Internal Medicine

## 2022-09-13 DIAGNOSIS — I10 Essential (primary) hypertension: Secondary | ICD-10-CM

## 2022-10-08 ENCOUNTER — Other Ambulatory Visit: Payer: Self-pay | Admitting: Internal Medicine

## 2022-10-08 DIAGNOSIS — I10 Essential (primary) hypertension: Secondary | ICD-10-CM

## 2022-10-09 ENCOUNTER — Other Ambulatory Visit: Payer: Self-pay | Admitting: Family Medicine

## 2022-10-09 ENCOUNTER — Other Ambulatory Visit: Payer: Self-pay | Admitting: *Deleted

## 2022-10-09 DIAGNOSIS — I10 Essential (primary) hypertension: Secondary | ICD-10-CM

## 2022-10-09 DIAGNOSIS — J439 Emphysema, unspecified: Secondary | ICD-10-CM

## 2022-10-09 MED ORDER — OLMESARTAN-AMLODIPINE-HCTZ 40-10-25 MG PO TABS: 1.0000 | ORAL_TABLET | Freq: Every day | ORAL | 0 refills | Status: DC

## 2022-10-09 MED ORDER — FLUTICASONE-SALMETEROL 250-50 MCG/ACT IN AEPB: 1.0000 | INHALATION_SPRAY | Freq: Two times a day (BID) | RESPIRATORY_TRACT | 11 refills | Status: DC

## 2022-10-09 NOTE — Progress Notes (Signed)
Medication sent to the pharmacy on file 

## 2022-10-09 NOTE — Telephone Encounter (Signed)
Next appt scheduled 10/24 with Dr Stann Mainland.

## 2022-10-09 NOTE — Progress Notes (Signed)
Irrigations sent to the pharmacy on file

## 2022-10-17 ENCOUNTER — Ambulatory Visit (INDEPENDENT_AMBULATORY_CARE_PROVIDER_SITE_OTHER): Payer: Medicare Other

## 2022-10-17 VITALS — BP 146/87 | HR 80 | Ht 71.0 in | Wt 219.1 lb

## 2022-10-17 VITALS — BP 141/84 | HR 82 | Ht 71.0 in | Wt 219.1 lb

## 2022-10-17 DIAGNOSIS — Z23 Encounter for immunization: Secondary | ICD-10-CM

## 2022-10-17 DIAGNOSIS — R195 Other fecal abnormalities: Secondary | ICD-10-CM | POA: Diagnosis not present

## 2022-10-17 DIAGNOSIS — F17210 Nicotine dependence, cigarettes, uncomplicated: Secondary | ICD-10-CM

## 2022-10-17 DIAGNOSIS — J439 Emphysema, unspecified: Secondary | ICD-10-CM

## 2022-10-17 DIAGNOSIS — I10 Essential (primary) hypertension: Secondary | ICD-10-CM

## 2022-10-17 DIAGNOSIS — E669 Obesity, unspecified: Secondary | ICD-10-CM

## 2022-10-17 DIAGNOSIS — F17200 Nicotine dependence, unspecified, uncomplicated: Secondary | ICD-10-CM

## 2022-10-17 DIAGNOSIS — G4730 Sleep apnea, unspecified: Secondary | ICD-10-CM

## 2022-10-17 DIAGNOSIS — R7303 Prediabetes: Secondary | ICD-10-CM | POA: Diagnosis not present

## 2022-10-17 DIAGNOSIS — Z0000 Encounter for general adult medical examination without abnormal findings: Secondary | ICD-10-CM | POA: Diagnosis not present

## 2022-10-17 DIAGNOSIS — E785 Hyperlipidemia, unspecified: Secondary | ICD-10-CM

## 2022-10-17 DIAGNOSIS — Z6830 Body mass index (BMI) 30.0-30.9, adult: Secondary | ICD-10-CM

## 2022-10-17 DIAGNOSIS — K08409 Partial loss of teeth, unspecified cause, unspecified class: Secondary | ICD-10-CM | POA: Diagnosis not present

## 2022-10-17 LAB — POCT GLYCOSYLATED HEMOGLOBIN (HGB A1C): Hemoglobin A1C: 6 % — AB (ref 4.0–5.6)

## 2022-10-17 LAB — GLUCOSE, CAPILLARY: Glucose-Capillary: 111 mg/dL — ABNORMAL HIGH (ref 70–99)

## 2022-10-17 MED ORDER — OLMESARTAN-AMLODIPINE-HCTZ 40-10-25 MG PO TABS: 1.0000 | ORAL_TABLET | Freq: Every day | ORAL | 0 refills | Status: DC

## 2022-10-17 MED ORDER — SPIRONOLACTONE 25 MG PO TABS: 25.0000 mg | ORAL_TABLET | Freq: Every day | ORAL | 3 refills | Status: DC

## 2022-10-17 MED ORDER — ROSUVASTATIN CALCIUM 20 MG PO TABS: 20.0000 mg | ORAL_TABLET | Freq: Every day | ORAL | 3 refills | Status: DC

## 2022-10-17 NOTE — Assessment & Plan Note (Addendum)
Says he is using Advair 2x daily and albuterol and flovent a few times a week up to once daily. He denies cough, sputum, dyspnea, chest pain. -Albuterol prn -Fluticasone-salmaterol BID -Ipratropium prn

## 2022-10-17 NOTE — Patient Instructions (Addendum)
Thank you, Mr.Josph D Doane for allowing [place] to provide your care today. Today we discussed :  High blood pressure- Please continue taking your medications, working on diet and exercise, limiting salt intake. I have refilled all of your medications. I will also work on getting your CPAP which will help with your blood pressure.  Colonoscopy- You have a referral to [facility] gastroenterology for colon cancer screening. The number is [phone]. Please call and schedule this.  Tooth- Your tooth fell out and does not look infected. No antibiotics are needed at this time. Please see your dentist as soon as you can.  I have ordered the following labs for you:  Lab Orders         Lipid Profile         BMP8+Anion Gap         POC Hbg A1C        Referrals ordered today:   Referral Orders  No referral(s) requested today     I have ordered the following medication/changed the following medications:   Stop the following medications: Medications Discontinued During This Encounter  Medication Reason   spironolactone (ALDACTONE) 25 MG tablet Reorder   rosuvastatin (CRESTOR) 20 MG tablet Reorder   Olmesartan-amLODIPine-HCTZ 40-10-25 MG TABS Reorder     Start the following medications: Meds ordered this encounter  Medications   Olmesartan-amLODIPine-HCTZ 40-10-25 MG TABS    Sig: Take 1 tablet by mouth daily. For high blood pressure    Dispense:  30 tablet    Refill:  0    Replaces Individual Rx of Losartan, Amlodipine, and HCTZ   spironolactone (ALDACTONE) 25 MG tablet    Sig: Take 1 tablet (25 mg total) by mouth daily. For high blood pressure    Dispense:  90 tablet    Refill:  3    DX Code Needed  .   rosuvastatin (CRESTOR) 20 MG tablet    Sig: Take 1 tablet (20 mg total) by mouth daily. For high cholesterol    Dispense:  90 tablet    Refill:  3     Follow up: 3-4 months     We look forward to seeing you next time. Please call our clinic at [phone] if you have any  questions or concerns. The best time to call is Monday-Friday from 9am-4pm, but there is someone available 24/7. If after hours or the weekend, call the main hospital number and ask for the Internal Medicine Resident On-Call. If you need medication refills, please notify your pharmacy one week in advance and they will send [place] a request.   Thank you for trusting me with your care. Wishing you the best!   Iona Coach, MD [place]

## 2022-10-17 NOTE — Assessment & Plan Note (Signed)
BP was 141/84 today in clinic. The patient does not have a cuff at home but says he will get one and record his blood pressure. He ran out of his spironolactone for the past months. He also says he has not gotten his CPAP. Denies CP, SOB, LE edema. Will refill spiro and work on getting CPAP and suspect he will be around goal range. -BMP -Toprol 200 -olmesartan-amlodipine-hctz 40-10-25 -spironolactone 25mg 

## 2022-10-17 NOTE — Progress Notes (Unsigned)
HTN -Toprol 200= remove if BP log good/ decrease dose -olmesartan-amlodipine-hctz 40-10-25 -annual BMP?  COPD Albuterol Fluticasone-salmaterol BID Ipratropium  OSA Has patient received CPAP?  HLD Goal <100, last 64 -rosuvastatin 20 -repeat?  Prediabetes A1c 08/2021 5.9. Repeat today?  HCM: Colonoscopy(fobt positive, colonoscopy ordered??????????) vs annual fobt flu

## 2022-10-17 NOTE — Progress Notes (Signed)
Subjective:   Christopher Thompson is a 66 y.o. male who presents for Medicare Annual/Subsequent preventive examination. I connected with  Christopher Thompson on 10/17/22 by a  Face-To-Face  enabled telemedicine application and verified that I am speaking with the correct person using two identifiers.  Patient Location: Other:  Office/Clinic  Provider Location: Office/Clinic  I discussed the limitations of evaluation and management by telemedicine. The patient expressed understanding and agreed to proceed.  Review of Systems    Defer to PCP       Objective:    Today's Vitals   10/17/22 1600  BP: (!) 141/84  Pulse: 82  SpO2: 98%  Weight: 219 lb 1.6 oz (99.4 kg)  Height: $Remove'5\' 11"'FTUaqym$  (1.803 m)   Body mass index is 30.56 kg/m.     10/17/2022    4:15 PM 10/17/2022    1:38 PM 06/15/2022    1:56 PM 01/31/2022    1:55 PM 09/22/2021    1:19 PM 04/20/2021    1:37 PM 10/04/2020    1:13 PM  Advanced Directives  Does Patient Have a Medical Advance Directive? No No No No No No No  Would patient like information on creating a medical advance directive? No - Patient declined No - Patient declined No - Patient declined No - Patient declined No - Patient declined No - Patient declined No - Patient declined    Current Medications (verified) Outpatient Encounter Medications as of 10/17/2022  Medication Sig   albuterol (VENTOLIN HFA) 108 (90 Base) MCG/ACT inhaler INHALE 1-2 PUFFS BY MOUTH EVERY 6 HOURS AS NEEDED FOR WHEEZE OR SHORTNESS OF BREATH   Blood Pressure Monitoring (BLOOD PRESSURE KIT) DEVI 1 Units by Does not apply route daily.   fluticasone-salmeterol (ADVAIR DISKUS) 250-50 MCG/ACT AEPB Inhale 1 puff into the lungs in the morning and at bedtime. For breathing. Use EVERY DAY.   ipratropium (ATROVENT HFA) 17 MCG/ACT inhaler Inhale 2 puffs into the lungs every 6 (six) hours as needed.   metoprolol (TOPROL-XL) 200 MG 24 hr tablet TAKE 1 TABLET (200 MG TOTAL) BY MOUTH DAILY. TAKE WITH OR IMMEDIATELY  FOLLOWING A MEAL. FOR HIGH BLOOD PRESSURE.   [DISCONTINUED] Olmesartan-amLODIPine-HCTZ 40-10-25 MG TABS Take 1 tablet by mouth daily. For high blood pressure   [DISCONTINUED] rosuvastatin (CRESTOR) 20 MG tablet Take 1 tablet (20 mg total) by mouth daily. For high cholesterol   [DISCONTINUED] spironolactone (ALDACTONE) 25 MG tablet Take 1 tablet (25 mg total) by mouth daily. For high blood pressure   No facility-administered encounter medications on file as of 10/17/2022.    Allergies (verified) Ace inhibitors   History: Past Medical History:  Diagnosis Date   COPD (chronic obstructive pulmonary disease) ([place]) 10/01/2006   Drug-induced hepatic toxicity 06/21/2007   Eczema 10/01/2006   Hyperlipidemia 10/01/2006   Hypertension 10/01/2006   Learning disability 01/07/2007   Determined in court for disability   Sleep apnea 10/01/2006   mild to moderate   Tobacco abuse 01/07/2007   No past surgical history on file. Family History  Problem Relation Age of Onset   Hypertension Mother    Alzheimer's disease Mother    Heart disease Father    Heart attack Father    Hypertension Father    Hypertension Sister    Heart disease Brother    Hypertension Son    Hypertension Sister    Social History   Socioeconomic History   Marital status: Legally Separated    Spouse name: Not on file  Number of children: Not on file   Years of education: 7th grade   Highest education level: Not on file  Occupational History   Occupation: disability    Comment: due to learning disability per patient; can only do physical work, and hurt his left shoulder on the job years ago  Tobacco Use   Smoking status: Some Days    Packs/day: 0.10    Years: 45.00    Total pack years: 4.50    Types: Cigarettes    Last attempt to quit: 09/19/2014    Years since quitting: 8.0   Smokeless tobacco: Never   Tobacco comments:    0-3 cigs per day  Vaping Use   Vaping Use: Never used  Substance and Sexual Activity    Alcohol use: No    Alcohol/week: 0.0 standard drinks of alcohol   Drug use: No   Sexual activity: Yes    Birth control/protection: None    Comment: Committed relationship x 8 years  Other Topics Concern   Not on file  Social History Narrative   Current Social History 07/22/2019        Patient lives alone in an apartment which is on the second floor. There are steps with handrails up to the entrance the patient uses.       Patient's method of transportation is personal car.      The highest level of education was 7th grade      The patient currently disabled.      Identified important Relationships are "My friend, Meredith Mody."       Pets : None       Interests / Fun: "Stay at home and watch TV. Walk every morning."       Current Stressors: "I really don't have any."       Religious / Personal Beliefs: Baptist       L. Silvano Rusk, RN, BSN       Social Determinants of Health   Financial Resource Strain: Low Risk  (10/17/2022)   Overall Financial Resource Strain (CARDIA)    Difficulty of Paying Living Expenses: Not hard at all  Food Insecurity: No Food Insecurity (10/17/2022)   Hunger Vital Sign    Worried About Running Out of Food in the Last Year: Never true    [place] in the Last Year: Never true  Transportation Needs: No Transportation Needs (10/17/2022)   PRAPARE - Hydrologist (Medical): No    Lack of Transportation (Non-Medical): No  Physical Activity: Sufficiently Active (10/17/2022)   Exercise Vital Sign    Days of Exercise per Week: 7 days    Minutes of Exercise per Session: 30 min  Stress: No Stress Concern Present (10/17/2022)   [place]    Feeling of Stress : Not at all  Social Connections: Socially Isolated (10/17/2022)   Social Connection and Isolation Panel [NHANES]    Frequency of Communication with Friends and Family: More than three times a week     Frequency of Social Gatherings with Friends and Family: More than three times a week    Attends Religious Services: Never    Marine scientist or Organizations: No    Attends Music therapist: Not on file    Marital Status: Separated    Tobacco Counseling Ready to quit: Not Answered Counseling given: Not Answered Tobacco comments: 0-3 cigs per day   Clinical Intake:  Pre-visit  preparation completed: Yes  Pain : No/denies pain     Nutritional Risks: None Diabetes: Yes  How often do you need to have someone help you when you read instructions, pamphlets, or other written materials from your doctor or pharmacy?: 1 - Never What is the last grade level you completed in school?: 7th grade  Diabetic?No  Interpreter Needed?: No  Information entered by :: Neal Oshea,cma 10/17/2022 4:02pm   Activities of Daily Living    10/17/2022    1:38 PM 06/15/2022    1:56 PM  In your present state of health, do you have any difficulty performing the following activities:  Hearing? 0 0  Vision? 0 0  Difficulty concentrating or making decisions? 0 0  Walking or climbing stairs? 0 0  Dressing or bathing? 0 0  Doing errands, shopping? 0 0    Patient Care Team: Idamae Schuller, MD as PCP - General  Indicate any recent Medical Services you may have received from other than [facility] providers in the past year (date may be approximate).     Assessment:   This is a routine wellness examination for Christopher Thompson.  Hearing/Vision screen No results found.  Dietary issues and exercise activities discussed:     Goals Addressed   None   Depression Screen    10/17/2022    1:38 PM 06/15/2022    1:57 PM 01/31/2022    1:57 PM 04/20/2021    1:37 PM 10/04/2020    1:13 PM 02/04/2020    2:06 PM 07/22/2019    2:46 PM  PHQ 2/9 Scores  PHQ - 2 Score 0 0 0 0 0 0 0  PHQ- 9 Score   0 0 0 0     Fall Risk    10/17/2022    4:16 PM 10/17/2022    1:38 PM 06/15/2022    1:55 PM 01/31/2022     1:57 PM 09/22/2021    1:26 PM  [place] in the past year? 0 0 0 0 0  Number falls in past yr: 0 0 0    Injury with Fall? 0 0 0    Risk for fall due to : No Fall Risks No Fall Risks No Fall Risks No Fall Risks No Fall Risks  Follow up Falls evaluation completed;Falls prevention discussed Falls evaluation completed;Falls prevention discussed Falls evaluation completed;Falls prevention discussed Falls evaluation completed Falls evaluation completed    FALL RISK PREVENTION PERTAINING TO THE HOME:  Any stairs in or around the home? Yes  If so, are there any without handrails? No  Home free of loose throw rugs in walkways, pet beds, electrical cords, etc? Yes  Adequate lighting in your home to reduce risk of falls? Yes   ASSISTIVE DEVICES UTILIZED TO PREVENT FALLS:  Life alert? No  Use of a cane, walker or w/c? No  Grab bars in the bathroom? No  Shower chair or bench in shower? No  Elevated toilet seat or a handicapped toilet? No   TIMED UP AND Thompson:  Was the test performed? Yes .  Length of time to ambulate 10 feet: 1 min.  Gait slow and steady without use of assistive device  Cognitive Function:        10/17/2022    4:16 PM  6CIT Screen  What Year? 0 points  What month? 0 points  What time? 0 points  Count back from 20 0 points  Months in reverse 0 points  Repeat phrase 0 points  Total  Score 0 points    Immunizations Immunization History  Administered Date(s) Administered   Fluad Quad(high Dose 65+) 09/22/2021, 10/17/2022   Influenza Split 10/02/2012   Influenza Whole 09/24/2006   Influenza,inj,Quad PF,6+ Mos 11/12/2013, 09/23/2014, 09/29/2015, 01/03/2017, 09/06/2017, 10/08/2018, 02/04/2020   PFIZER(Purple Top)SARS-COV-2 Vaccination 04/12/2020, 05/03/2020, 12/21/2020   PNEUMOCOCCAL CONJUGATE-20 01/31/2022   Pneumococcal Polysaccharide-23 06/28/2016   Tdap 03/18/2014    TDAP status: Up to date  Flu Vaccine status: Up to date  Pneumococcal vaccine  status: Up to date  Covid-19 vaccine status: Completed vaccines  Qualifies for Shingles Vaccine? No   Zostavax completed No   Shingrix Completed?: No.    Education has been provided regarding the importance of this vaccine. Patient has been advised to call insurance company to determine out of pocket expense if they have not yet received this vaccine. Advised may also receive vaccine at local pharmacy or Health Dept. Verbalized acceptance and understanding.  Screening Tests Health Maintenance  Topic Date Due   Zoster Vaccines- Shingrix (1 of 2) Never done   COLON CANCER SCREENING ANNUAL FOBT  05/10/2019   COLONOSCOPY (Pts 45-13yrs Insurance coverage will need to be confirmed)  03/29/2020   Medicare Annual Wellness (AWV)  11/17/2023   TETANUS/TDAP  03/18/2024   Pneumonia Vaccine 43+ Years old  Completed   INFLUENZA VACCINE  Completed   Hepatitis C Screening  Completed   HPV VACCINES  Aged Out   COVID-19 Vaccine  Discontinued    Health Maintenance  Health Maintenance Due  Topic Date Due   Zoster Vaccines- Shingrix (1 of 2) Never done   COLON CANCER SCREENING ANNUAL FOBT  05/10/2019   COLONOSCOPY (Pts 45-57yrs Insurance coverage will need to be confirmed)  03/29/2020      Lung Cancer Screening: (Low Dose CT Chest recommended if Age 72-80 years, 30 pack-year currently smoking OR have quit w/in 15years.) does not qualify.   Lung Cancer Screening Referral: N/A  Additional Screening:  Hepatitis C Screening: does not qualify; Completed 06/28/2016 Vision Screening: Recommended annual ophthalmology exams for early detection of glaucoma and other disorders of the eye. Is the patient up to date with their annual eye exam?  Yes  Who is the provider or what is the name of the office in which the patient attends annual eye exams?  If pt is not established with a provider, would they like to be referred to a provider to establish care? No .   Dental Screening: Recommended annual dental  exams for proper oral hygiene  Community Resource Referral / Chronic Care Management: CRR required this visit?  No   CCM required this visit?  No      Plan:     I have personally reviewed and noted the following in the patient's chart:   Medical and social history Use of alcohol, tobacco or illicit drugs  Current medications and supplements including opioid prescriptions. Patient is not currently taking opioid prescriptions. Functional ability and status Nutritional status Physical activity Advanced directives List of other physicians Hospitalizations, surgeries, and ER visits in previous 12 months Vitals Screenings to include cognitive, depression, and falls Referrals and appointments  In addition, I have reviewed and discussed with patient certain preventive protocols, quality metrics, and best practice recommendations. A written personalized care plan for preventive services as well as general preventive health recommendations were provided to patient.     Kerin Perna, [place]   10/17/2022   Nurse Notes: Face-To-Face 10 minute visit   Mr. Detloff , Thank you  for taking time to come for your Medicare Wellness Visit. I appreciate your ongoing commitment to your health goals. Please review the following plan we discussed and let me know if I can assist you in the future.   These are the goals we discussed:  Goals       Blood Pressure < 150/90      Per JNC8      LDL CALC < 130      Patient Stated (pt-stated)      Get colonoscopy by end of 2020      Quit Smoking (pt-stated)      By the end of 2020        This is a list of the screening recommended for you and due dates:  Health Maintenance  Topic Date Due   Zoster (Shingles) Vaccine (1 of 2) Never done   Stool Blood Test  05/10/2019   Colon Cancer Screening  03/29/2020   Medicare Annual Wellness Visit  11/17/2023   Tetanus Vaccine  03/18/2024   Pneumonia Vaccine  Completed   Flu Shot  Completed   Hepatitis C  Screening: USPSTF Recommendation to screen - Ages 18-79 yo.  Completed   HPV Vaccine  Aged Out   COVID-19 Vaccine  Discontinued

## 2022-10-17 NOTE — Assessment & Plan Note (Addendum)
Goal LDL <100 for primary prevention with no diabetes, last LDL 64 08/2021. -rosuvastatin 20 -repeat lipid panel

## 2022-10-18 DIAGNOSIS — K08409 Partial loss of teeth, unspecified cause, unspecified class: Secondary | ICD-10-CM

## 2022-10-18 DIAGNOSIS — R7303 Prediabetes: Secondary | ICD-10-CM | POA: Insufficient documentation

## 2022-10-18 HISTORY — DX: Partial loss of teeth, unspecified cause, unspecified class: K08.409

## 2022-10-18 LAB — LIPID PANEL
Chol/HDL Ratio: 3.1 ratio (ref 0.0–5.0)
Cholesterol, Total: 126 mg/dL (ref 100–199)
HDL: 41 mg/dL (ref >39)
LDL Chol Calc (NIH): 67 mg/dL (ref 0–99)
Triglycerides: 95 mg/dL (ref 0–149)
VLDL Cholesterol Cal: 18 mg/dL (ref 5–40)

## 2022-10-18 LAB — BMP8+ANION GAP
Anion Gap: 16 mmol/L (ref 10.0–18.0)
BUN/Creatinine Ratio: 11 (ref 10–24)
BUN: 12 mg/dL (ref 8–27)
CO2: 21 mmol/L (ref 20–29)
Calcium: 9.7 mg/dL (ref 8.6–10.2)
Chloride: 103 mmol/L (ref 96–106)
Creatinine, Ser: 1.12 mg/dL (ref 0.76–1.27)
Glucose: 98 mg/dL (ref 70–99)
Potassium: 3.7 mmol/L (ref 3.5–5.2)
Sodium: 140 mmol/L (ref 134–144)
eGFR: 72 mL/min/{1.73_m2} (ref >59)

## 2022-10-18 NOTE — Assessment & Plan Note (Signed)
Patient continues to endorse smoking and wanting to stop. He is interested in wellbutrin as he is not entirely ready to set a quit date on chantix. Will discuss this with him at next visit. -will need aortic aneurysm screening as a male 72-67 y/o with smoking history

## 2022-10-18 NOTE — Progress Notes (Signed)
Established Patient Office Visit  Subjective   Patient ID: Christopher Thompson, male    DOB: 03-04-1956  Age: 66 y.o. MRN: [id number]  Chief Complaint  Patient presents with   Hypertension   Medication Refill    Refill all medications  [id number] - [place], [place] - [address] OF [address]     Dental Injury    Tooth fell out requesting antibiotic to prevent infection until he sees his dentist    [name] is a 66 y/o male with a pmh outlined below. Please see encounter tab for HPI and A/P information.  Hypertension  Medication Refill  Dental Injury       Review of Systems  All other systems reviewed and are negative.     Objective:     BP (!) 146/87 (BP Location: Left Arm, Cuff Size: Normal)   Pulse 80   Ht 5\' 11"  (1.803 m)   Wt 219 lb 1.6 oz (99.4 kg)   SpO2 98%   BMI 30.56 kg/m    Physical Exam Constitutional:      General: He is not in acute distress.    Appearance: Normal appearance. He is obese.  HENT:     Mouth/Throat:     Mouth: Mucous membranes are moist.     Pharynx: Oropharynx is clear.     Comments: Loss of second maxillary incisor on the right. No erythema, drainage, pus or abscess. Cardiovascular:     Rate and Rhythm: Normal rate and regular rhythm.     Pulses: Normal pulses.     Heart sounds: Normal heart sounds. No murmur heard.    No gallop.  Pulmonary:     Effort: Pulmonary effort is normal. No respiratory distress.     Breath sounds: Normal breath sounds. No wheezing or rales.  Musculoskeletal:     Right lower leg: No edema.     Left lower leg: No edema.  Skin:    General: Skin is warm and dry.     Capillary Refill: Capillary refill takes less than 2 seconds.  Neurological:     Mental Status: He is alert.      Results for orders placed or performed in visit on 10/17/22  Lipid Profile  Result Value Ref Range   Cholesterol, Total 126 100 - 199 mg/dL   Triglycerides 95 0 - 149 mg/dL    HDL 41 10/19/22 mg/dL   VLDL Cholesterol Cal 18 5 - 40 mg/dL   LDL Chol Calc (NIH) 67 0 - 99 mg/dL   Chol/HDL Ratio 3.1 0.0 - 5.0 ratio  BMP8+Anion Gap  Result Value Ref Range   Glucose 98 70 - 99 mg/dL   BUN 12 8 - 27 mg/dL   Creatinine, Ser >69 0.76 - 1.27 mg/dL   eGFR 72 6.78 >93   BUN/Creatinine Ratio 11 10 - 24   Sodium 140 134 - 144 mmol/L   Potassium 3.7 3.5 - 5.2 mmol/L   Chloride 103 96 - 106 mmol/L   CO2 21 20 - 29 mmol/L   Anion Gap 16.0 10.0 - 18.0 mmol/L   Calcium 9.7 8.6 - 10.2 mg/dL  POC Hbg YB/OFB/5.10  Result Value Ref Range   Hemoglobin A1C 6.0 (A) 4.0 - 5.6 %   HbA1c POC (<> result, manual entry)     HbA1c, POC (prediabetic range)     HbA1c, POC (controlled diabetic range)    Results for orders placed or performed in visit on  10/17/22  Glucose, capillary  Result Value Ref Range   Glucose-Capillary 111 (H) 70 - 99 mg/dL      The ASCVD Risk score (Arnett DK, et al., 2019) failed to calculate for the following reasons:   The valid total cholesterol range is 130 to 320 mg/dL    Assessment & Plan:   Problem List Items Addressed This Visit       Cardiovascular and Mediastinum   Essential hypertension (Chronic)    BP was 141/84 today in clinic. The patient does not have a cuff at home but says he will get one and record his blood pressure. He ran out of his spironolactone for the past months. He also says he has not gotten his CPAP. Denies CP, SOB, LE edema. Will refill spiro and work on getting CPAP and suspect he will be around goal range. Patient had normal aldo + renin levels in 2021. Calcium on BMP is normal. Last TSH in 2017 was around 0.5. Given that the patient is on 5 BP medications, may be worth rechecking TSH and considering renal ultrasound. -BMP -Toprol 200 -olmesartan-amlodipine-hctz 40-10-25 -spironolactone 25mg       Relevant Medications   Olmesartan-amLODIPine-HCTZ 40-10-25 MG TABS   spironolactone (ALDACTONE) 25 MG tablet   rosuvastatin  (CRESTOR) 20 MG tablet   Other Relevant Orders   BMP8+Anion Gap (Completed)     Respiratory   Pulmonary emphysema (HCC) (Chronic)    Says he is using Advair 2x daily and albuterol and flovent a few times a week up to once daily. He denies cough, sputum, dyspnea, chest pain. -Albuterol prn -Fluticasone-salmaterol BID -Ipratropium prn      Sleep apnea   Relevant Orders   For home use only DME continuous positive airway pressure (CPAP)     Digestive   Loss of single tooth    Patient lost his second maxillary incisor on the right side. He denies fever and pain currently and has not noticed any bleeding or drainage. On exam there is no erythema, pus, abscess. Appears to be healing well. Patient requested prophylactic antibiotics and it was discussed that these are not needed at this time given no active infection and no cardiac risk factors for endocarditis. Encourage him to see a dentist.        Other   HLD (hyperlipidemia)    Goal LDL <100 for primary prevention with no diabetes, LDL today was 67. -rosuvastatin 20       Relevant Medications   Olmesartan-amLODIPine-HCTZ 40-10-25 MG TABS   spironolactone (ALDACTONE) 25 MG tablet   rosuvastatin (CRESTOR) 20 MG tablet   Other Relevant Orders   Lipid Profile (Completed)   Tobacco use disorder    Patient continues to endorse smoking and wanting to stop. He is interested in wellbutrin as he is not entirely ready to set a quit date on chantix. Will discuss this with him at next visit. -will need aortic aneurysm screening as a male 25-75 y/o with smoking history      Fecal occult blood test positive    The patient was given the number to call and schedule his colonoscopy. Please ensure this is done at follow up.      Prediabetes    A1c increased to 6.0 from 5.9 one year ago. Continue annual A1c screening.      Relevant Orders   POC Hbg A1C (Completed)   Other Visit Diagnoses     Need for immunization against influenza    -   Primary  Relevant Orders   Flu Vaccine QUAD High Dose(Fluad) (Completed)   Obesity (BMI 30-39.9)       Relevant Orders   Lipid Profile (Completed)   POC Hbg A1C (Completed)   Need for immunization with diphtheria, tetanus, and poliovirus vaccine           Return in about 3 months (around 01/17/2023).    Iona Coach, MD

## 2022-10-18 NOTE — Assessment & Plan Note (Signed)
A1c increased to 6.0 from 5.9 one year ago. Continue annual A1c screening.

## 2022-10-18 NOTE — Assessment & Plan Note (Signed)
The patient was given the number to call and schedule his colonoscopy. Please ensure this is done at follow up.

## 2022-10-18 NOTE — Assessment & Plan Note (Signed)
Patient lost his second maxillary incisor on the right side. He denies fever and pain currently and has not noticed any bleeding or drainage. On exam there is no erythema, pus, abscess. Appears to be healing well. Patient requested prophylactic antibiotics and it was discussed that these are not needed at this time given no active infection and no cardiac risk factors for endocarditis. Encourage him to see a Pharmacist, community.

## 2022-10-19 NOTE — Progress Notes (Signed)
Internal Medicine Clinic Attending  I saw and evaluated the patient.  I personally confirmed the key portions of the history and exam documented by Dr. Rogers and I reviewed pertinent patient test results.  The assessment, diagnosis, and plan were formulated together and I agree with the documentation in the resident's note.  

## 2022-10-19 NOTE — Addendum Note (Signed)
Addended by: Jodean Lima on: 10/19/2022 08:52 AM   Modules accepted: Level of Service

## 2022-10-25 MED ORDER — BUPROPION HCL ER (XL) 150 MG PO TB24: ORAL_TABLET | ORAL | 0 refills | Status: DC

## 2022-10-25 NOTE — Addendum Note (Signed)
Addended by: Iona Coach on: 10/25/2022 08:42 AM   Modules accepted: Orders

## 2022-10-25 NOTE — Progress Notes (Signed)
Patient called and notified that cholesterol, kidney function, electrolytes are all within normal limits. He asked about smoking cessation so Wellbutrin was prescribe at 150x3 days then 150 BID if tolerating well. He was counseled that smoking cessation should occur and if no significant progress is made the medication will not be continueed longer than 12 weeks.

## 2022-10-31 NOTE — Addendum Note (Signed)
Addended by: Iona Coach on: 10/31/2022 01:24 PM   Modules accepted: Level of Service

## 2022-11-16 ENCOUNTER — Other Ambulatory Visit: Payer: Self-pay

## 2022-11-16 DIAGNOSIS — J439 Emphysema, unspecified: Secondary | ICD-10-CM

## 2022-11-16 DIAGNOSIS — F17200 Nicotine dependence, unspecified, uncomplicated: Secondary | ICD-10-CM

## 2023-01-04 ENCOUNTER — Other Ambulatory Visit: Payer: Self-pay | Admitting: Internal Medicine

## 2023-01-04 ENCOUNTER — Other Ambulatory Visit: Payer: Self-pay

## 2023-01-04 DIAGNOSIS — F17200 Nicotine dependence, unspecified, uncomplicated: Secondary | ICD-10-CM

## 2023-01-04 DIAGNOSIS — J439 Emphysema, unspecified: Secondary | ICD-10-CM

## 2023-01-04 DIAGNOSIS — I10 Essential (primary) hypertension: Secondary | ICD-10-CM

## 2023-01-10 ENCOUNTER — Other Ambulatory Visit: Payer: Self-pay | Admitting: Internal Medicine

## 2023-01-10 DIAGNOSIS — I10 Essential (primary) hypertension: Secondary | ICD-10-CM

## 2023-02-06 ENCOUNTER — Encounter: Payer: 59 | Admitting: Internal Medicine

## 2023-02-08 ENCOUNTER — Other Ambulatory Visit: Payer: Self-pay | Admitting: Student

## 2023-02-08 ENCOUNTER — Other Ambulatory Visit: Payer: Self-pay | Admitting: Internal Medicine

## 2023-02-08 DIAGNOSIS — I10 Essential (primary) hypertension: Secondary | ICD-10-CM

## 2023-02-08 DIAGNOSIS — J439 Emphysema, unspecified: Secondary | ICD-10-CM

## 2023-03-03 ENCOUNTER — Other Ambulatory Visit: Payer: Self-pay | Admitting: Internal Medicine

## 2023-03-03 DIAGNOSIS — I10 Essential (primary) hypertension: Secondary | ICD-10-CM

## 2023-04-16 ENCOUNTER — Encounter: Payer: Self-pay | Admitting: Internal Medicine

## 2023-04-16 ENCOUNTER — Other Ambulatory Visit: Payer: Self-pay

## 2023-04-16 ENCOUNTER — Ambulatory Visit: Payer: 59

## 2023-04-16 ENCOUNTER — Ambulatory Visit (INDEPENDENT_AMBULATORY_CARE_PROVIDER_SITE_OTHER): Payer: 59 | Admitting: Internal Medicine

## 2023-04-16 VITALS — BP 132/73 | HR 87 | Temp 98.2°F | Ht 70.0 in | Wt 216.0 lb

## 2023-04-16 DIAGNOSIS — K625 Hemorrhage of anus and rectum: Secondary | ICD-10-CM | POA: Diagnosis not present

## 2023-04-16 DIAGNOSIS — I10 Essential (primary) hypertension: Secondary | ICD-10-CM

## 2023-04-16 DIAGNOSIS — R195 Other fecal abnormalities: Secondary | ICD-10-CM

## 2023-04-16 DIAGNOSIS — Z0000 Encounter for general adult medical examination without abnormal findings: Secondary | ICD-10-CM

## 2023-04-16 DIAGNOSIS — F17210 Nicotine dependence, cigarettes, uncomplicated: Secondary | ICD-10-CM

## 2023-04-16 DIAGNOSIS — E785 Hyperlipidemia, unspecified: Secondary | ICD-10-CM

## 2023-04-16 DIAGNOSIS — F17200 Nicotine dependence, unspecified, uncomplicated: Secondary | ICD-10-CM

## 2023-04-16 DIAGNOSIS — Z1211 Encounter for screening for malignant neoplasm of colon: Secondary | ICD-10-CM

## 2023-04-16 DIAGNOSIS — G4733 Obstructive sleep apnea (adult) (pediatric): Secondary | ICD-10-CM | POA: Diagnosis not present

## 2023-04-16 DIAGNOSIS — G4730 Sleep apnea, unspecified: Secondary | ICD-10-CM

## 2023-04-16 DIAGNOSIS — J439 Emphysema, unspecified: Secondary | ICD-10-CM

## 2023-04-16 MED ORDER — FLUTICASONE-SALMETEROL 250-50 MCG/ACT IN AEPB: 1.0000 | INHALATION_SPRAY | Freq: Two times a day (BID) | RESPIRATORY_TRACT | 11 refills | Status: DC

## 2023-04-16 NOTE — Patient Instructions (Addendum)
Christopher Thompson, it was a pleasure seeing you today! You endorsed feeling well today. Below are some of the things we talked about this visit. We look forward to seeing you in the follow up appointment!  Today we discussed: Continue taking your medications We will reorder sleep study today.  Call stomach doctor's: Gastroenterologist in [place], [facility] [place] Address: [address] [place], [place], [place] [address] Phone: [phone]  I have ordered the following labs today:  Lab Orders         CBC no Diff        Referrals ordered today:   Referral Orders         Ambulatory referral to Sleep Studies       I have ordered the following medication/changed the following medications:   Stop the following medications: Medications Discontinued During This Encounter  Medication Reason   fluticasone-salmeterol (ADVAIR DISKUS) 250-50 MCG/ACT AEPB Reorder   ATROVENT HFA 17 MCG/ACT inhaler Patient has not taken in last 30 days     Start the following medications: Meds ordered this encounter  Medications   fluticasone-salmeterol (ADVAIR DISKUS) 250-50 MCG/ACT AEPB    Sig: Inhale 1 puff into the lungs in the morning and at bedtime. For breathing. Use EVERY DAY.    Dispense:  60 each    Refill:  11    Please reinforce proper use of all inhalers for this patient. He was using Adviar as needed.     Follow-up: 3 month  Please make sure to arrive 15 minutes prior to your next appointment. If you arrive late, you may be asked to reschedule.   We look forward to seeing you next time. Please call our clinic at [phone] if you have any questions or concerns. The best time to call is Monday-Friday from 9am-4pm, but there is someone available 24/7. If after hours or the weekend, call the main hospital number and ask for the Internal Medicine Resident On-Call. If you need medication refills, please notify your pharmacy one week in advance and they will send [place] a request.  Thank you for letting  us take part in your care. Wishing you the best!  Thank you, Gwenevere Abbot, MD

## 2023-04-16 NOTE — Progress Notes (Unsigned)
   CC: follow up  HPI:  Mr.Shadeed D Hinderman is a 67 y.o. with medical history of HTN, HLD, COPD, moderate OSA and prediabetes presenting to [facility] for a follow up.   Please see problem-based list for further details, assessments, and plans.  Past Medical History:  Diagnosis Date   COPD (chronic obstructive pulmonary disease) 10/01/2006   Drug-induced hepatic toxicity 06/21/2007   Eczema 10/01/2006   Hyperlipidemia 10/01/2006   Hypertension 10/01/2006   Learning disability 01/07/2007   Determined in court for disability   Sleep apnea 10/01/2006   mild to moderate   Tobacco abuse 01/07/2007     Current Outpatient Medications (Cardiovascular):    metoprolol (TOPROL-XL) 200 MG 24 hr tablet, TAKE 1 TABLET (200 MG TOTAL) BY MOUTH DAILY. TAKE WITH OR IMMEDIATELY FOLLOWING A MEAL. FOR HIGH BLOOD PRESSURE.   Olmesartan-amLODIPine-HCTZ 40-10-25 MG TABS, TAKE 1 TABLET BY MOUTH EVERY DAY FOR HIGH BLOOD PRESSURE   rosuvastatin (CRESTOR) 20 MG tablet, Take 1 tablet (20 mg total) by mouth daily. For high cholesterol   spironolactone (ALDACTONE) 25 MG tablet, Take 1 tablet (25 mg total) by mouth daily. For high blood pressure  Current Outpatient Medications (Respiratory):    albuterol (VENTOLIN HFA) 108 (90 Base) MCG/ACT inhaler, INHALE 1-2 PUFFS BY MOUTH EVERY 6 HOURS AS NEEDED FOR WHEEZE OR SHORTNESS OF BREATH   ATROVENT HFA 17 MCG/ACT inhaler, TAKE 2 PUFFS BY MOUTH EVERY 6 HOURS AS NEEDED   fluticasone-salmeterol (ADVAIR DISKUS) 250-50 MCG/ACT AEPB, Inhale 1 puff into the lungs in the morning and at bedtime. For breathing. Use EVERY DAY.    Current Outpatient Medications (Other):    Blood Pressure Monitoring (BLOOD PRESSURE KIT) DEVI, 1 Units by Does not apply route daily.   buPROPion (WELLBUTRIN XL) 150 MG 24 hr tablet, TAKE 1 TABLET BY MOUTH TWICE A DAY  Review of Systems:  Review of system negative unless stated in the problem list or HPI.    Physical Exam:  Vitals:   04/16/23 1317   BP: 132/73  Pulse: 87  Temp: 98.2 F (36.8 C)  TempSrc: Oral  SpO2: 99%  Weight: 216 lb (98 kg)  Height:  (1.778 m)   Physical Exam General: NAD HENT: NCAT Lungs: CTAB, no wheeze, rhonchi or rales.  Cardiovascular: Normal heart sounds, no r/m/g, 2+ pulses in all extremities. No LE edema Abdomen: No TTP, normal bowel sounds MSK: No asymmetry or muscle atrophy.  Skin: no lesions noted on exposed skin Neuro: Alert and oriented x4. CN grossly intact Psych: Normal mood and normal affect   Assessment & Plan:   No problem-specific Assessment & Plan notes found for this encounter.   See Encounters Tab for problem based charting.  Patient Discussed with Dr. {NAMES:[id number]::"Guilloud","Hoffman","Mullen","Narendra","Vincent","Machen","Lau","Hatcher","Williams"} Gwenevere Abbot, MD Eligha Bridegroom. [facility] Internal Medicine Residency, PGY-2   HTN On Metoprolol 200 mg qd, Spiro 25 mg qd, and Olmesartan-Amlodipine-HCTZ 40-10-25. Last BMP in 09/2022 normal. Repeat BMP  Care Gaps Colonoscopy Weight loss 245 in 08/2021-->226-->05/2022-->216 today  Tobacco use disorder Wellbutrin 150 mg BID only smoking 2-3 cigarettes a day. Takes wellbutrin 150 mg BID  COPD Mild; on advair BID and prn albuterol about 2 times a week.   HLD Well controlled on rosuvastatin 20 mg qd.  Prediabetes: 6.0 in 09/2022 diet controlled.

## 2023-04-17 ENCOUNTER — Telehealth: Payer: Self-pay | Admitting: *Deleted

## 2023-04-17 LAB — CBC
Hematocrit: 44.1 % (ref 37.5–51.0)
Hemoglobin: 15.3 g/dL (ref 13.0–17.7)
MCH: 31.6 pg (ref 26.6–33.0)
MCHC: 34.7 g/dL (ref 31.5–35.7)
MCV: 91 fL (ref 79–97)
Platelets: 218 10*3/uL (ref 150–450)
RBC: 4.84 x10E6/uL (ref 4.14–5.80)
RDW: 13.7 % (ref 11.6–15.4)
WBC: 8.9 10*3/uL (ref 3.4–10.8)

## 2023-04-17 NOTE — Assessment & Plan Note (Signed)
Repeat sleep study ordered (see HTN).

## 2023-04-17 NOTE — Assessment & Plan Note (Signed)
Wellbutrin 150 mg BID only smoking 2-3 cigarettes a day. Takes wellbutrin 150 mg BID. States he would like to continue taking this medication. Advised pt that habit is driving more of his tobacco use at this point than cravings and advised to quit completely.

## 2023-04-17 NOTE — Telephone Encounter (Signed)
   Telephone encounter was:  Successful.  04/17/2023 Name: Christopher Thompson MRN: [id number] DOB: 03-22-1956  Christopher Thompson is a 67 y.o. year old male who is a primary care patient of Gwenevere Abbot, MD . The community resource team was consulted for assistance with Transportation Needs   Care guide performed the following interventions: Patient provided with information about care guide support team and interviewed to confirm resource needs.Patient is aware he has [employer] dual complete and only needs to call number on back of card when he has scheduled any appts which he needs transportation to   Follow Up Plan:  No further follow up planned at this time. The patient has been provided with needed resources.  Yehuda Mao Greenauer -[facility], Population Health [phone] [address] [place] , [place] [address] Email : [email or web address]

## 2023-04-17 NOTE — Assessment & Plan Note (Signed)
Well controlled on rosuvastatin 20 mg qd. Advised to continue crestor 20 mg qd.

## 2023-04-17 NOTE — Assessment & Plan Note (Signed)
Pt has hx of HTN. He is at goal with BP 132/73. On Metoprolol 200 mg qd, Spiro 25 mg qd, and Olmesartan-Amlodipine-HCTZ 40-10-25. Last BMP in 09/2022 normal. Given pt with hx of moderate OSA, plan to repeat sleep study to rule out as cause of resistant hypertension. Pt had moderate OSA with AHI of 24 and significant hypoxemia during sleep. Repeat sleep study ordered. Pt understands the importance of getting this done. No hx of renal artery ultrasound, but this can be considered after sleep study.

## 2023-04-17 NOTE — Assessment & Plan Note (Addendum)
Given positive hemoccult stools and age, pt needs diagnostic colonoscopy. Colonoscopy already authorized so advised pt to call the office to schedule it. Stressed the importance of this to the patient weight loss that pt states is intentional.  Weight was 226 one year ago and 216 today. Will stress importance of this at every visit. CBC was checked to look for IDA given positive stool and was normal.

## 2023-04-17 NOTE — Assessment & Plan Note (Signed)
Pt has mild COPD. No hx of COPD exacerbation. Pt is on Adviar and uses albuterol once or twice a week with increased activity. I advised pt he may need to have his inhalers changed in a future visit as his COPD is well controlled and ICS place him at risk and daily LAMA might be best for him. Given focus of the visit was on getting pt to have colonoscopy completed, this change was not made at this visit.

## 2023-04-17 NOTE — Assessment & Plan Note (Signed)
Visit with GI authorized. Gave pt the number to call to make the appointment. Placed referral for care coordination as pt states lack of transportation back from colonoscopy is limiting his exam.

## 2023-04-18 NOTE — Progress Notes (Signed)
Internal Medicine Clinic Attending  Case discussed with Dr. Khan  At the time of the visit.  We reviewed the resident's history and exam and pertinent patient test results.  I agree with the assessment, diagnosis, and plan of care documented in the resident's note.  

## 2023-05-11 ENCOUNTER — Telehealth: Payer: Self-pay | Admitting: Internal Medicine

## 2023-05-11 ENCOUNTER — Other Ambulatory Visit: Payer: Self-pay | Admitting: Internal Medicine

## 2023-05-11 DIAGNOSIS — F17200 Nicotine dependence, unspecified, uncomplicated: Secondary | ICD-10-CM

## 2023-05-11 DIAGNOSIS — J439 Emphysema, unspecified: Secondary | ICD-10-CM

## 2023-05-11 NOTE — Telephone Encounter (Signed)
Contacted Christopher Thompson to schedule their annual wellness visit. Appointment made for 05/30/2023.  [facility] Care Guide [facility] AWV TEAM Direct Dial: [phone]

## 2023-05-30 ENCOUNTER — Ambulatory Visit (INDEPENDENT_AMBULATORY_CARE_PROVIDER_SITE_OTHER): Payer: 59

## 2023-05-30 VITALS — Ht 70.0 in | Wt 216.0 lb

## 2023-05-30 DIAGNOSIS — Z0000 Encounter for general adult medical examination without abnormal findings: Secondary | ICD-10-CM

## 2023-05-30 NOTE — Patient Instructions (Signed)
Christopher Thompson , Thank you for taking time to come for your Medicare Wellness Visit. I appreciate your ongoing commitment to your health goals. Please review the following plan we discussed and let me know if I can assist you in the future.   These are the goals we discussed:  Goals       Blood Pressure < 150/90      Per JNC8      LDL CALC < 130      Patient Stated (pt-stated)      Get colonoscopy by end of 2020      Quit Smoking (pt-stated)      By the end of 2020      Remain actvie and independent        This is a list of the screening recommended for you and due dates:  Health Maintenance  Topic Date Due   Zoster (Shingles) Vaccine (1 of 2) Never done   Stool Blood Test  11/29/2023*   Colon Cancer Screening  05/29/2024*   Flu Shot  07/26/2023   DTaP/Tdap/Td vaccine (2 - Td or Tdap) 03/18/2024   Medicare Annual Wellness Visit  05/29/2024   Pneumonia Vaccine  Completed   Hepatitis C Screening  Completed   HPV Vaccine  Aged Out   COVID-19 Vaccine  Discontinued  *Topic was postponed. The date shown is not the original due date.    Advanced directives: Information on Advanced Care Planning can be found at [facility] Advance Health Care Directives Advance Health Care Directives ([email or web address]  Please bring a copy of your health care power of attorney and living will to the office to be added to your chart at your convenience.   Conditions/risks identified: Aim for 30 minutes of exercise or brisk walking, 6-8 glasses of water, and 5 servings of fruits and vegetables each day.  Next appointment: Follow up in one year for your annual wellness visit.   Preventive Care 25 Years and Older, Male  Preventive care refers to lifestyle choices and visits with your health care provider that can promote health and wellness. What does preventive care include? A yearly physical exam. This is also called an annual well check. Dental exams once or twice a year. Routine eye  exams. Ask your health care provider how often you should have your eyes checked. Personal lifestyle choices, including: Daily care of your teeth and gums. Regular physical activity. Eating a healthy diet. Avoiding tobacco and drug use. Limiting alcohol use. Practicing safe sex. Taking low doses of aspirin every day. Taking vitamin and mineral supplements as recommended by your health care provider. What happens during an annual well check? The services and screenings done by your health care provider during your annual well check will depend on your age, overall health, lifestyle risk factors, and family history of disease. Counseling  Your health care provider may ask you questions about your: Alcohol use. Tobacco use. Drug use. Emotional well-being. Home and relationship well-being. Sexual activity. Eating habits. History of falls. Memory and ability to understand (cognition). Work and work Astronomer. Screening  You may have the following tests or measurements: Height, weight, and BMI. Blood pressure. Lipid and cholesterol levels. These may be checked every 5 years, or more frequently if you are over 26 years old. Skin check. Lung cancer screening. You may have this screening every year starting at age 20 if you have a 30-pack-year history of smoking and currently smoke or have quit within the past 15  years. Fecal occult blood test (FOBT) of the stool. You may have this test every year starting at age 44. Flexible sigmoidoscopy or colonoscopy. You may have a sigmoidoscopy every 5 years or a colonoscopy every 10 years starting at age 66. Prostate cancer screening. Recommendations will vary depending on your family history and other risks. Hepatitis C blood test. Hepatitis B blood test. Sexually transmitted disease (STD) testing. Diabetes screening. This is done by checking your blood sugar (glucose) after you have not eaten for a while (fasting). You may have this done every  1-3 years. Abdominal aortic aneurysm (AAA) screening. You may need this if you are a current or former smoker. Osteoporosis. You may be screened starting at age 105 if you are at high risk. Talk with your health care provider about your test results, treatment options, and if necessary, the need for more tests. Vaccines  Your health care provider may recommend certain vaccines, such as: Influenza vaccine. This is recommended every year. Tetanus, diphtheria, and acellular pertussis (Tdap, Td) vaccine. You may need a Td booster every 10 years. Zoster vaccine. You may need this after age 65. Pneumococcal 13-valent conjugate (PCV13) vaccine. One dose is recommended after age 96. Pneumococcal polysaccharide (PPSV23) vaccine. One dose is recommended after age 72. Talk to your health care provider about which screenings and vaccines you need and how often you need them. This information is not intended to replace advice given to you by your health care provider. Make sure you discuss any questions you have with your health care provider. Document Released: 01/07/2016 Document Revised: 08/30/2016 Document Reviewed: 10/12/2015 Elsevier Interactive Patient Education  2017 [employer].  Fall Prevention in the Home Falls can cause injuries. They can happen to people of all ages. There are many things you can do to make your home safe and to help prevent falls. What can I do on the outside of my home? Regularly fix the edges of walkways and driveways and fix any cracks. Remove anything that might make you trip as you walk through a door, such as a raised step or threshold. Trim any bushes or trees on the path to your home. Use bright outdoor lighting. Clear any walking paths of anything that might make someone trip, such as rocks or tools. Regularly check to see if handrails are loose or broken. Make sure that both sides of any steps have handrails. Any raised decks and porches should have guardrails on  the edges. Have any leaves, snow, or ice cleared regularly. Use sand or salt on walking paths during winter. Clean up any spills in your garage right away. This includes oil or grease spills. What can I do in the bathroom? Use night lights. Install grab bars by the toilet and in the tub and shower. Do not use towel bars as grab bars. Use non-skid mats or decals in the tub or shower. If you need to sit down in the shower, use a plastic, non-slip stool. Keep the floor dry. Clean up any water that spills on the floor as soon as it happens. Remove soap buildup in the tub or shower regularly. Attach bath mats securely with double-sided non-slip rug tape. Do not have throw rugs and other things on the floor that can make you trip. What can I do in the bedroom? Use night lights. Make sure that you have a light by your bed that is easy to reach. Do not use any sheets or blankets that are too big for your bed.  They should not hang down onto the floor. Have a firm chair that has side arms. You can use this for support while you get dressed. Do not have throw rugs and other things on the floor that can make you trip. What can I do in the kitchen? Clean up any spills right away. Avoid walking on wet floors. Keep items that you use a lot in easy-to-reach places. If you need to reach something above you, use a strong step stool that has a grab bar. Keep electrical cords out of the way. Do not use floor polish or wax that makes floors slippery. If you must use wax, use non-skid floor wax. Do not have throw rugs and other things on the floor that can make you trip. What can I do with my stairs? Do not leave any items on the stairs. Make sure that there are handrails on both sides of the stairs and use them. Fix handrails that are broken or loose. Make sure that handrails are as long as the stairways. Check any carpeting to make sure that it is firmly attached to the stairs. Fix any carpet that is loose  or worn. Avoid having throw rugs at the top or bottom of the stairs. If you do have throw rugs, attach them to the floor with carpet tape. Make sure that you have a light switch at the top of the stairs and the bottom of the stairs. If you do not have them, ask someone to add them for you. What else can I do to help prevent falls? Wear shoes that: Do not have high heels. Have rubber bottoms. Are comfortable and fit you well. Are closed at the toe. Do not wear sandals. If you use a stepladder: Make sure that it is fully opened. Do not climb a closed stepladder. Make sure that both sides of the stepladder are locked into place. Ask someone to hold it for you, if possible. Clearly mark and make sure that you can see: Any grab bars or handrails. First and last steps. Where the edge of each step is. Use tools that help you move around (mobility aids) if they are needed. These include: Canes. Walkers. Scooters. Crutches. Turn on the lights when you go into a dark area. Replace any light bulbs as soon as they burn out. Set up your furniture so you have a clear path. Avoid moving your furniture around. If any of your floors are uneven, fix them. If there are any pets around you, be aware of where they are. Review your medicines with your doctor. Some medicines can make you feel dizzy. This can increase your chance of falling. Ask your doctor what other things that you can do to help prevent falls. This information is not intended to replace advice given to you by your health care provider. Make sure you discuss any questions you have with your health care provider. Document Released: 10/07/2009 Document Revised: 05/18/2016 Document Reviewed: 01/15/2015 Elsevier Interactive Patient Education  2017 [employer].

## 2023-05-30 NOTE — Progress Notes (Addendum)
I reviewed the AWV findings with the provider who conducted the visit. I was present in the office suite and immediately available to provide assistance and direction throughout the time the service was provided.   Christopher Thompson, D.O.  Internal Medicine Resident, PGY-2 [facility]  7:54 AM, 06/01/2023      Subjective:   Christopher Thompson is a 67 y.o. male who presents for Medicare Annual/Subsequent preventive examination.  I connected with  Christopher Thompson on 05/30/23 by a audio enabled telemedicine application and verified that I am speaking with the correct person using two identifiers.  Patient Location: Home  Provider Location: Home Office  I discussed the limitations of evaluation and management by telemedicine. The patient expressed understanding and agreed to proceed.  Review of Systems     Cardiac Risk Factors include: advanced age (>34men, >38 women);hypertension;male gender     Objective:    Today's Vitals   05/30/23 1305  Weight: 216 lb (98 kg)  Height: 5\' 10"  (1.778 m)   Body mass index is 30.99 kg/m.     05/30/2023    1:19 PM 04/16/2023    1:19 PM 10/17/2022    4:15 PM 10/17/2022    1:38 PM 06/15/2022    1:56 PM 01/31/2022    1:55 PM 09/22/2021    1:19 PM  Advanced Directives  Does Patient Have a Medical Advance Directive? No No No No No No No  Would patient like information on creating a medical advance directive? Yes (MAU/Ambulatory/Procedural Areas - Information given) No - Patient declined No - Patient declined No - Patient declined No - Patient declined No - Patient declined No - Patient declined    Current Medications (verified) Outpatient Encounter Medications as of 05/30/2023  Medication Sig   albuterol (VENTOLIN HFA) 108 (90 Base) MCG/ACT inhaler INHALE 1-2 PUFFS BY MOUTH EVERY 6 HOURS AS NEEDED FOR WHEEZE OR SHORTNESS OF BREATH   Blood Pressure Monitoring (BLOOD PRESSURE KIT) DEVI 1 Units by Does not apply route daily.    buPROPion (WELLBUTRIN XL) 150 MG 24 hr tablet TAKE 1 TABLET BY MOUTH TWICE A DAY   fluticasone-salmeterol (ADVAIR DISKUS) 250-50 MCG/ACT AEPB Inhale 1 puff into the lungs in the morning and at bedtime. For breathing. Use EVERY DAY.   metoprolol (TOPROL-XL) 200 MG 24 hr tablet TAKE 1 TABLET (200 MG TOTAL) BY MOUTH DAILY. TAKE WITH OR IMMEDIATELY FOLLOWING A MEAL. FOR HIGH BLOOD PRESSURE.   Olmesartan-amLODIPine-HCTZ 40-10-25 MG TABS TAKE 1 TABLET BY MOUTH EVERY DAY FOR HIGH BLOOD PRESSURE   rosuvastatin (CRESTOR) 20 MG tablet Take 1 tablet (20 mg total) by mouth daily. For high cholesterol   spironolactone (ALDACTONE) 25 MG tablet Take 1 tablet (25 mg total) by mouth daily. For high blood pressure   No facility-administered encounter medications on file as of 05/30/2023.    Allergies (verified) Ace inhibitors   History: Past Medical History:  Diagnosis Date   COPD (chronic obstructive pulmonary disease) (HCC) 10/01/2006   Drug-induced hepatic toxicity 06/21/2007   Eczema 10/01/2006   Hyperlipidemia 10/01/2006   Hypertension 10/01/2006   Learning disability 01/07/2007   Determined in court for disability   Loss of single tooth 10/18/2022   Sleep apnea 10/01/2006   mild to moderate   Tobacco abuse 01/07/2007   History reviewed. No pertinent surgical history. Family History  Problem Relation Age of Onset   Hypertension Mother    Alzheimer's disease Mother    Heart disease Father  Heart attack Father    Hypertension Father    Hypertension Sister    Heart disease Brother    Hypertension Son    Hypertension Sister    Social History   Socioeconomic History   Marital status: Legally Separated    Spouse name: Not on file   Number of children: Not on file   Years of education: 7th grade   Highest education level: Not on file  Occupational History   Occupation: disability    Comment: due to learning disability per patient; can only do physical work, and hurt his left shoulder  on the job years ago  Tobacco Use   Smoking status: Some Days    Packs/day: 0.10    Years: 45.00    Additional pack years: 0.00    Total pack years: 4.50    Types: Cigarettes    Last attempt to quit: 09/19/2014    Years since quitting: 8.6   Smokeless tobacco: Never   Tobacco comments:    0-3 cigs per day  Vaping Use   Vaping Use: Never used  Substance and Sexual Activity   Alcohol use: No    Alcohol/week: 0.0 standard drinks of alcohol   Drug use: No   Sexual activity: Yes    Birth control/protection: None    Comment: Committed relationship x 8 years  Other Topics Concern   Not on file  Social History Narrative   Current Social History 07/22/2019        Patient lives alone in an apartment which is on the second floor. There are steps with handrails up to the entrance the patient uses.       Patient's method of transportation is personal car.      The highest level of education was 7th grade      The patient currently disabled.      Identified important Relationships are "My friend, Dedra Skeens."       Pets : None       Interests / Fun: "Stay at home and watch TV. Walk every morning."       Current Stressors: "I really don't have any."       Religious / Personal Beliefs: Baptist       L. Leward Quan, RN, BSN       Social Determinants of Health   Financial Resource Strain: Low Risk  (05/30/2023)   Overall Financial Resource Strain (CARDIA)    Difficulty of Paying Living Expenses: Not hard at all  Food Insecurity: No Food Insecurity (05/30/2023)   Hunger Vital Sign    Worried About Running Out of Food in the Last Year: Never true    Ran Out of Food in the Last Year: Never true  Transportation Needs: No Transportation Needs (05/30/2023)   PRAPARE - Administrator, Civil Service (Medical): No    Lack of Transportation (Non-Medical): No  Physical Activity: Sufficiently Active (05/30/2023)   Exercise Vital Sign    Days of Exercise per Week: 7 days    Minutes of  Exercise per Session: 30 min  Stress: No Stress Concern Present (05/30/2023)   [employer] of Occupational Health - Occupational Stress Questionnaire    Feeling of Stress : Not at all  Social Connections: Socially Isolated (05/30/2023)   Social Connection and Isolation Panel [NHANES]    Frequency of Communication with Friends and Family: More than three times a week    Frequency of Social Gatherings with Friends and Family: Three times a week  Attends Religious Services: Never    Active Member of Clubs or Organizations: No    Attends Banker Meetings: Never    Marital Status: Separated    Tobacco Counseling Ready to quit: Not Answered Counseling given: Not Answered Tobacco comments: 0-3 cigs per day   Clinical Intake:  Pre-visit preparation completed: Yes  Pain : No/denies pain  Diabetes: No  How often do you need to have someone help you when you read instructions, pamphlets, or other written materials from your doctor or pharmacy?: 1 - Never  Diabetic?No   Interpreter Needed?: No  Information entered by :: Kandis Fantasia LPN   Activities of Daily Living    05/30/2023    1:19 PM 04/16/2023    1:15 PM  In your present state of health, do you have any difficulty performing the following activities:  Hearing? 0 0  Vision? 0 0  Difficulty concentrating or making decisions? 0 0  Walking or climbing stairs? 0 0  Dressing or bathing? 0 0  Doing errands, shopping? 0 0  Preparing Food and eating ? N   Using the Toilet? N   In the past six months, have you accidently leaked urine? N   Do you have problems with loss of bowel control? N   Managing your Medications? N   Managing your Finances? N   Housekeeping or managing your Housekeeping? N     Patient Care Team: Gwenevere Abbot, MD as PCP - General  Indicate any recent Medical Services you may have received from other than [facility] providers in the past year (date may be approximate).     Assessment:    This is a routine wellness examination for Aries.  Hearing/Vision screen Hearing Screening - Comments:: Denies hearing difficulties   Vision Screening - Comments:: No vision problems; will schedule routine eye exam soon    Dietary issues and exercise activities discussed: Current Exercise Habits: Home exercise routine, Type of exercise: walking;stretching, Time (Minutes): 30, Frequency (Times/Week): 7, Weekly Exercise (Minutes/Week): 210, Intensity: Mild   Goals Addressed               This Visit's Progress     Patient Stated (pt-stated)   Not on track     Get colonoscopy by end of 2020      Remain actvie and independent         Depression Screen    05/30/2023    1:23 PM 04/16/2023    1:15 PM 10/17/2022    1:38 PM 06/15/2022    1:57 PM 01/31/2022    1:57 PM 04/20/2021    1:37 PM 10/04/2020    1:13 PM  PHQ 2/9 Scores  PHQ - 2 Score 0 0 0 0 0 0 0  PHQ- 9 Score 0 0   0 0 0    Fall Risk    04/16/2023    1:15 PM 10/17/2022    4:16 PM 10/17/2022    1:38 PM 06/15/2022    1:55 PM 01/31/2022    1:57 PM  Fall Risk   Falls in the past year? 0 0 0 0 0  Number falls in past yr:  0 0 0   Injury with Fall?  0 0 0   Risk for fall due to : No Fall Risks No Fall Risks No Fall Risks No Fall Risks No Fall Risks  Follow up Falls evaluation completed Falls evaluation completed;Falls prevention discussed Falls evaluation completed;Falls prevention discussed Falls evaluation completed;Falls prevention discussed Falls evaluation  completed    FALL RISK PREVENTION PERTAINING TO THE HOME:  Any stairs in or around the home? No  If so, are there any without handrails? No  Home free of loose throw rugs in walkways, pet beds, electrical cords, etc? Yes  Adequate lighting in your home to reduce risk of falls? Yes   ASSISTIVE DEVICES UTILIZED TO PREVENT FALLS:  Life alert? No  Use of a cane, walker or w/c? No  Grab bars in the bathroom? Yes  Shower chair or bench in shower? No  Elevated toilet  seat or a handicapped toilet? Yes   TIMED UP AND GO:  Was the test performed? No . Telephonic visit   Cognitive Function:        05/30/2023    1:23 PM 10/17/2022    4:16 PM  6CIT Screen  What Year? 0 points 0 points  What month? 0 points 0 points  What time? 0 points 0 points  Count back from 20 0 points 0 points  Months in reverse 0 points 0 points  Repeat phrase 0 points 0 points  Total Score 0 points 0 points    Immunizations Immunization History  Administered Date(s) Administered   Fluad Quad(high Dose 65+) 09/22/2021, 10/17/2022   Influenza Split 10/02/2012   Influenza Whole 09/24/2006   Influenza,inj,Quad PF,6+ Mos 11/12/2013, 09/23/2014, 09/29/2015, 01/03/2017, 09/06/2017, 10/08/2018, 02/04/2020   PFIZER(Purple Top)SARS-COV-2 Vaccination 04/12/2020, 05/03/2020, 12/21/2020   PNEUMOCOCCAL CONJUGATE-20 01/31/2022   Pneumococcal Polysaccharide-23 06/28/2016   Tdap 03/18/2014    TDAP status: Up to date  Pneumococcal vaccine status: Up to date  Covid-19 vaccine status: Information provided on how to obtain vaccines.   Qualifies for Shingles Vaccine? Yes   Zostavax completed No   Shingrix Completed?: No.    Education has been provided regarding the importance of this vaccine. Patient has been advised to call insurance company to determine out of pocket expense if they have not yet received this vaccine. Advised may also receive vaccine at local pharmacy or Health Dept. Verbalized acceptance and understanding.  Screening Tests Health Maintenance  Topic Date Due   Zoster Vaccines- Shingrix (1 of 2) Never done   COLON CANCER SCREENING ANNUAL FOBT  11/29/2023 (Originally 05/10/2019)   Colonoscopy  05/29/2024 (Originally 03/29/2020)   INFLUENZA VACCINE  07/26/2023   DTaP/Tdap/Td (2 - Td or Tdap) 03/18/2024   Medicare Annual Wellness (AWV)  05/29/2024   Pneumonia Vaccine 67+ Years old  Completed   Hepatitis C Screening  Completed   HPV VACCINES  Aged Out   COVID-19  Vaccine  Discontinued    Health Maintenance  Health Maintenance Due  Topic Date Due   Zoster Vaccines- Shingrix (1 of 2) Never done    Colorectal cancer screening:  Patient declines at this time   Lung Cancer Screening: (Low Dose CT Chest recommended if Age 79-80 years, 30 pack-year currently smoking OR have quit w/in 15years.) does not qualify.   Lung Cancer Screening Referral: n/a  Additional Screening:  Hepatitis C Screening: does qualify; Completed 06/28/16  Vision Screening: Recommended annual ophthalmology exams for early detection of glaucoma and other disorders of the eye. Is the patient up to date with their annual eye exam?  No  Who is the provider or what is the name of the office in which the patient attends annual eye exams? none If pt is not established with a provider, would they like to be referred to a provider to establish care? No .   Dental Screening: Recommended annual  dental exams for proper oral hygiene  Community Resource Referral / Chronic Care Management: CRR required this visit?  No   CCM required this visit?  No      Plan:     I have personally reviewed and noted the following in the patient's chart:   Medical and social history Use of alcohol, tobacco or illicit drugs  Current medications and supplements including opioid prescriptions. Patient is not currently taking opioid prescriptions. Functional ability and status Nutritional status Physical activity Advanced directives List of other physicians Hospitalizations, surgeries, and ER visits in previous 12 months Vitals Screenings to include cognitive, depression, and falls Referrals and appointments  In addition, I have reviewed and discussed with patient certain preventive protocols, quality metrics, and best practice recommendations. A written personalized care plan for preventive services as well as general preventive health recommendations were provided to patient.     Durwin Nora, [place]   12/30/1094   Due to this being a virtual visit, the after visit summary with patients personalized plan was offered to patient via mail or my-chart. Patient declined at this time.  Nurse Notes: No concerns

## 2023-06-10 NOTE — Progress Notes (Signed)
Internal Medicine Clinic Attending  I reviewed the AWV findings.  I agree with the assessment, diagnosis, and plan of care documented in the AWV note.     

## 2023-07-15 ENCOUNTER — Other Ambulatory Visit: Payer: Self-pay | Admitting: Internal Medicine

## 2023-07-15 DIAGNOSIS — J439 Emphysema, unspecified: Secondary | ICD-10-CM

## 2023-08-24 ENCOUNTER — Other Ambulatory Visit: Payer: Self-pay | Admitting: Internal Medicine

## 2023-08-24 DIAGNOSIS — I10 Essential (primary) hypertension: Secondary | ICD-10-CM

## 2023-09-03 ENCOUNTER — Other Ambulatory Visit: Payer: Self-pay | Admitting: Internal Medicine

## 2023-09-03 DIAGNOSIS — I10 Essential (primary) hypertension: Secondary | ICD-10-CM

## 2023-09-10 ENCOUNTER — Encounter: Payer: 59 | Admitting: Internal Medicine

## 2023-11-05 ENCOUNTER — Encounter: Payer: Self-pay | Admitting: Internal Medicine

## 2023-11-05 ENCOUNTER — Ambulatory Visit: Payer: 59 | Admitting: Internal Medicine

## 2023-11-05 VITALS — BP 115/82 | HR 83 | Temp 98.4°F | Wt 192.6 lb

## 2023-11-05 DIAGNOSIS — R195 Other fecal abnormalities: Secondary | ICD-10-CM

## 2023-11-05 DIAGNOSIS — J439 Emphysema, unspecified: Secondary | ICD-10-CM

## 2023-11-05 DIAGNOSIS — F17210 Nicotine dependence, cigarettes, uncomplicated: Secondary | ICD-10-CM

## 2023-11-05 DIAGNOSIS — Z23 Encounter for immunization: Secondary | ICD-10-CM | POA: Diagnosis not present

## 2023-11-05 DIAGNOSIS — I10 Essential (primary) hypertension: Secondary | ICD-10-CM

## 2023-11-05 DIAGNOSIS — F17200 Nicotine dependence, unspecified, uncomplicated: Secondary | ICD-10-CM

## 2023-11-05 DIAGNOSIS — J449 Chronic obstructive pulmonary disease, unspecified: Secondary | ICD-10-CM

## 2023-11-05 DIAGNOSIS — E785 Hyperlipidemia, unspecified: Secondary | ICD-10-CM

## 2023-11-05 DIAGNOSIS — G4730 Sleep apnea, unspecified: Secondary | ICD-10-CM

## 2023-11-05 DIAGNOSIS — R7303 Prediabetes: Secondary | ICD-10-CM

## 2023-11-05 MED ORDER — STIOLTO RESPIMAT 2.5-2.5 MCG/ACT IN AERS
2.0000 | INHALATION_SPRAY | Freq: Every day | RESPIRATORY_TRACT | 3 refills | Status: DC
Start: 2023-11-05 — End: 2024-05-12

## 2023-11-05 NOTE — Patient Instructions (Addendum)
Thank you, [name] for allowing [place] to provide your care today. I'm glad to hear you're feeling well. Today we discussed:  High blood pressure: Your blood pressure today is looking good! We are following up on that sleep study for you and will let you know about it.   COPD: Please STOP taking the Advair inhaler and START taking Stiolo, 2 puffs every day. Also, keep up trying to quit those last few cigarettes!   Colonoscopy: Please make sure to talk to Dr. Welton Flakes about the colonoscopy at your next visit.  I went ahead and placed referral as it takes time to get in with GI, he was concerned about colon cancer with your blood in stool and weight loss. We have placed the referral to make that easier.   For everything else, keep up the good work! For your labwork, we'll give you a call if there are any concerns.   I have ordered the following labs for you:  Lab Orders         Lipid Profile         BMP8+Anion Gap         Hemoglobin A1c       Referrals ordered today:   Referral Orders         Ambulatory referral to Gastroenterology       I have ordered the following medication/changed the following medications:   Stop the following medications: Medications Discontinued During This Encounter  Medication Reason   buPROPion (WELLBUTRIN XL) 150 MG 24 hr tablet    fluticasone-salmeterol (ADVAIR DISKUS) 250-50 MCG/ACT AEPB      Start the following medications: Meds ordered this encounter  Medications   Tiotropium Bromide-Olodaterol (STIOLTO RESPIMAT) 2.5-2.5 MCG/ACT AERS    Sig: Inhale 2 puffs into the lungs daily.    Dispense:  4 g    Refill:  3     Follow up:  1 month  with Dr. Welton Flakes  We look forward to seeing you next time. Please call our clinic at [phone] if you have any questions or concerns. The best time to call is Monday-Friday from 9am-4pm, but there is someone available 24/7. If after hours or the weekend, call the main hospital number and ask for the Internal  Medicine Resident On-Call. If you need medication refills, please notify your pharmacy one week in advance and they will send [place] a request.   Thank you for trusting me with your care. Wishing you the best!   Thea Alken, MS3

## 2023-11-05 NOTE — Progress Notes (Signed)
This is a Psychologist, occupational Note.  The care of the patient was discussed with Dr. Antony Contras and the assessment and plan was formulated with their assistance.  Please see their note for official documentation of the patient encounter.   Subjective:   Patient ID: Christopher Thompson male   DOB: September 30, 1956 67 y.o.   MRN: [id number]  HPI: [name] is a 67 y.o. male presenting for routine care and medication refills.  Past Medical History:  Diagnosis Date   COPD (chronic obstructive pulmonary disease) (HCC) 10/01/2006   Drug-induced hepatic toxicity 06/21/2007   Eczema 10/01/2006   Hyperlipidemia 10/01/2006   Hypertension 10/01/2006   Learning disability 01/07/2007   Determined in court for disability   Loss of single tooth 10/18/2022   Sleep apnea 10/01/2006   mild to moderate   Tobacco abuse 01/07/2007   Current Outpatient Medications  Medication Sig Dispense Refill   Tiotropium Bromide-Olodaterol (STIOLTO RESPIMAT) 2.5-2.5 MCG/ACT AERS Inhale 2 puffs into the lungs daily. 4 g 3   albuterol (VENTOLIN HFA) 108 (90 Base) MCG/ACT inhaler INHALE 1-2 PUFFS BY MOUTH EVERY 6 HOURS AS NEEDED FOR WHEEZE OR SHORTNESS OF BREATH 6.7 each 5   Blood Pressure Monitoring (BLOOD PRESSURE KIT) DEVI 1 Units by Does not apply route daily. 1 each 0   metoprolol (TOPROL-XL) 200 MG 24 hr tablet TAKE 1 TABLET (200 MG TOTAL) BY MOUTH DAILY. TAKE WITH OR IMMEDIATELY FOLLOWING A MEAL. FOR HIGH BLOOD PRESSURE. 90 tablet 3   Olmesartan-amLODIPine-HCTZ 40-10-25 MG TABS TAKE 1 TABLET BY MOUTH EVERY DAY FOR HIGH BLOOD PRESSURE 90 tablet 1   rosuvastatin (CRESTOR) 20 MG tablet Take 1 tablet (20 mg total) by mouth daily. For high cholesterol 90 tablet 3   spironolactone (ALDACTONE) 25 MG tablet Take 1 tablet (25 mg total) by mouth daily. For high blood pressure 90 tablet 3   No current facility-administered medications for this visit.   Family History  Problem Relation Age of Onset   Hypertension Mother     Alzheimer's disease Mother    Heart disease Father    Heart attack Father    Hypertension Father    Hypertension Sister    Heart disease Brother    Hypertension Son    Hypertension Sister    Social History   Socioeconomic History   Marital status: Legally Separated    Spouse name: Not on file   Number of children: Not on file   Years of education: 7th grade   Highest education level: Not on file  Occupational History   Occupation: disability    Comment: due to learning disability per patient; can only do physical work, and hurt his left shoulder on the job years ago  Tobacco Use   Smoking status: Some Days    Current packs/day: 0.00    Average packs/day: 0.1 packs/day for 45.0 years (4.5 ttl pk-yrs)    Types: Cigarettes    Start date: 09/19/1969    Last attempt to quit: 09/19/2014    Years since quitting: 9.1   Smokeless tobacco: Never   Tobacco comments:    0-3 cigs per day  Vaping Use   Vaping status: Never Used  Substance and Sexual Activity   Alcohol use: No    Alcohol/week: 0.0 standard drinks of alcohol   Drug use: No   Sexual activity: Yes    Birth control/protection: None    Comment: Committed relationship x 8 years  Other Topics Concern   Not on file  Social History Narrative   Current Social History 07/22/2019        Patient lives alone in an apartment which is on the second floor. There are steps with handrails up to the entrance the patient uses.       Patient's method of transportation is personal car.      The highest level of education was 7th grade      The patient currently disabled.      Identified important Relationships are "My friend, Dedra Skeens."       Pets : None       Interests / Fun: "Stay at home and watch TV. Walk every morning."       Current Stressors: "I really don't have any."       Religious / Personal Beliefs: Baptist       L. Leward Quan, RN, BSN       Social Determinants of Health   Financial Resource Strain: Low Risk   (05/30/2023)   Overall Financial Resource Strain (CARDIA)    Difficulty of Paying Living Expenses: Not hard at all  Food Insecurity: No Food Insecurity (05/30/2023)   Hunger Vital Sign    Worried About Running Out of Food in the Last Year: Never true    Ran Out of Food in the Last Year: Never true  Transportation Needs: No Transportation Needs (05/30/2023)   PRAPARE - Administrator, Civil Service (Medical): No    Lack of Transportation (Non-Medical): No  Physical Activity: Sufficiently Active (05/30/2023)   Exercise Vital Sign    Days of Exercise per Week: 7 days    Minutes of Exercise per Session: 30 min  Stress: No Stress Concern Present (05/30/2023)   [employer] of Occupational Health - Occupational Stress Questionnaire    Feeling of Stress : Not at all  Social Connections: Socially Isolated (05/30/2023)   Social Connection and Isolation Panel [NHANES]    Frequency of Communication with Friends and Family: More than three times a week    Frequency of Social Gatherings with Friends and Family: Three times a week    Attends Religious Services: Never    Active Member of Clubs or Organizations: No    Attends Banker Meetings: Never    Marital Status: Separated   Review of Systems: Pertinent items noted in HPI and remainder of comprehensive ROS otherwise negative.  Objective:  Physical Exam: Vitals:   11/05/23 1309  BP: 115/82  Pulse: 83  Temp: 98.4 F (36.9 C)  TempSrc: Oral  SpO2: 100%  Weight: 192 lb 9.6 oz (87.4 kg)   BP 115/82 (BP Location: Right Arm, Patient Position: Sitting, Cuff Size: Normal)   Pulse 83   Temp 98.4 F (36.9 C) (Oral)   Wt 192 lb 9.6 oz (87.4 kg)   SpO2 100%   BMI 27.64 kg/m   General appearance: alert, cooperative, and no distress Lungs: clear to auscultation bilaterally, no wheezes or crackles Heart: regular rate and rhythm, S1, S2 normal, no murmur, click, rub or gallop  Assessment & Plan:   Essential hypertension   BP today is 115/82, controlled with metoprolol 200 mg daily, spirolactone 25 mg daily, and Olmesartan-Amlodipine-HCTZ 40-10-25 daily. Last BMP in 09/2022 was normal. Previous sleep study in 2021 showed moderate OSA and renin-aldosterone in 2021 was normal. Requires in-person CPAP titration sleep study. If treating OSA does not improve HTN, consider renal artery stenosis.  - Continue metoprolol 200 mg daily, spirolactone 25 mg daily, and Olmesartan-Amlodipine-HCTZ 40-10-25 daily -  Schedule CPAP titration sleep study - BMP today  Mild COPD On Advair daily and uses albuterol maybe 2x per week. No history of exacerbations or sputum production. Does not feel like Advair is helping him as much as the albuterol and that it runs out quicker than it should. Amenable to switching to LAMA-LABA, as the ICS would have increased risk of infection with minimal benefit. Is not experiencing chest pain or significant shortness of breath. - Stop Advair - Start Stiolto 2.5-2.5 - Continue albuterol as needed  Tobacco use disorder Reports quitting for 2 weeks, but was unable to sustain and is back on 2-3 cigarettes per day. Tried Wellbutrin 150 mg BID, but states it made him want to smoke more. Was counseled on benefits of cessation and he is committed to continuing to try to quit.   Hx of positive hemoccult stool Given age, history of positive hemoccult, and weight loss, even if intentional, should get colonoscopy as soon as possible. Weight down to 192 today, total 24 lbs since Jun 2024 (5 months). Has previously spoken to Dr. Welton Flakes and would like to continue that conversation with the more familiar provider. Was counseled that delays in colonoscopy could delay care for potentially serious conditions. Referral to GI was placed, as it can take several weeks for appointment. Patient verbalized understanding to set up colonoscopy appointment as soon as possible.  - GI referral placed - RTC in 1 month with  Dr.Khan  Hyperlipidemia Controlled with rosuvastatin 20 mg daily. Lipid panel today.   Health maintenance Received flu vaccine today. Hx of prediabetes with an A1c of 6% on 10/17/2022; repeat A1c today.   Thea Alken, MS3

## 2023-11-06 LAB — BMP8+ANION GAP
Anion Gap: 13 mmol/L (ref 10.0–18.0)
BUN/Creatinine Ratio: 14 (ref 10–24)
BUN: 16 mg/dL (ref 8–27)
CO2: 22 mmol/L (ref 20–29)
Calcium: 10.5 mg/dL — ABNORMAL HIGH (ref 8.6–10.2)
Chloride: 105 mmol/L (ref 96–106)
Creatinine, Ser: 1.12 mg/dL (ref 0.76–1.27)
Glucose: 95 mg/dL (ref 70–99)
Potassium: 4.2 mmol/L (ref 3.5–5.2)
Sodium: 140 mmol/L (ref 134–144)
eGFR: 72 mL/min/{1.73_m2} (ref >59)

## 2023-11-06 LAB — LIPID PANEL
Chol/HDL Ratio: 2.9 ratio (ref 0.0–5.0)
Cholesterol, Total: 147 mg/dL (ref 100–199)
HDL: 50 mg/dL (ref >39)
LDL Chol Calc (NIH): 86 mg/dL (ref 0–99)
Triglycerides: 50 mg/dL (ref 0–149)
VLDL Cholesterol Cal: 11 mg/dL (ref 5–40)

## 2023-11-06 LAB — HEMOGLOBIN A1C
Est. average glucose Bld gHb Est-mCnc: 128 mg/dL
Hgb A1c MFr Bld: 6.1 % — ABNORMAL HIGH (ref 4.8–5.6)

## 2023-11-06 NOTE — Progress Notes (Signed)
Internal Medicine Clinic Attending  Case discussed with the resident at the time of the visit.  We reviewed the resident's history and exam and pertinent patient test results.  I agree with the assessment, diagnosis, and plan of care documented in the resident's note.  

## 2023-11-06 NOTE — Assessment & Plan Note (Signed)
BP today is 115/82, controlled with metoprolol 200 mg daily, spirolactone 25 mg daily, and Olmesartan-Amlodipine-HCTZ 40-10-25 daily. Last BMP in 09/2022 was normal. Previous sleep study in 2021 showed moderate OSA and renin-aldosterone in 2021 was normal. Requires in-person CPAP titration sleep study. If treating OSA does not improve HTN, consider renal artery stenosis.  - Continue metoprolol 200 mg daily, spirolactone 25 mg daily, and Olmesartan-Amlodipine-HCTZ 40-10-25 daily - Schedule CPAP titration sleep study - BMP today

## 2023-11-06 NOTE — Assessment & Plan Note (Signed)
Given age, history of positive hemoccult, and weight loss, even if intentional, should get colonoscopy as soon as possible. Weight down to 192 today, total 24 lbs since Jun 2024 (5 months). Has previously spoken to Dr. Welton Flakes and would like to continue that conversation with the more familiar provider. Was counseled that delays in colonoscopy could delay care for potentially serious conditions. Referral to GI was placed, as it can take several weeks for appointment. Patient verbalized understanding to set up colonoscopy appointment as soon as possible.  - GI referral placed - RTC in 1 month with Dr.Khan

## 2023-11-06 NOTE — Addendum Note (Signed)
Addended by: Burnell Blanks on: 11/06/2023 09:39 AM   Modules accepted: Level of Service

## 2023-11-06 NOTE — Assessment & Plan Note (Signed)
Reports quitting for 2 weeks, but was unable to sustain and is back on 2-3 cigarettes per day. Tried Wellbutrin 150 mg BID, but states it made him want to smoke more. Was counseled on benefits of cessation and he is committed to continuing to try to quit.

## 2023-11-06 NOTE — Assessment & Plan Note (Signed)
A1c of 6% on 10/17/2022; repeat A1c today

## 2023-11-06 NOTE — Assessment & Plan Note (Signed)
On Advair daily and uses albuterol maybe 2x per week. No history of exacerbations or sputum production. Does not feel like Advair is helping him as much as the albuterol and that it runs out quicker than it should. Amenable to switching to LAMA-LABA, as the ICS would have increased risk of infection with minimal benefit. Is not experiencing chest pain or significant shortness of breath. - Stop Advair - Start Stiolto 2.5-2.5 - Continue albuterol as needed

## 2023-11-07 ENCOUNTER — Encounter: Payer: Self-pay | Admitting: Internal Medicine

## 2023-11-07 HISTORY — DX: Hypercalcemia: E83.52

## 2023-12-27 ENCOUNTER — Other Ambulatory Visit: Payer: Self-pay | Admitting: Internal Medicine

## 2023-12-27 ENCOUNTER — Other Ambulatory Visit: Payer: Self-pay | Admitting: Student

## 2023-12-27 DIAGNOSIS — I10 Essential (primary) hypertension: Secondary | ICD-10-CM

## 2023-12-27 DIAGNOSIS — J439 Emphysema, unspecified: Secondary | ICD-10-CM

## 2023-12-27 DIAGNOSIS — F17200 Nicotine dependence, unspecified, uncomplicated: Secondary | ICD-10-CM

## 2024-01-03 ENCOUNTER — Encounter: Payer: Self-pay | Admitting: Internal Medicine

## 2024-01-21 ENCOUNTER — Other Ambulatory Visit: Payer: Self-pay | Admitting: Internal Medicine

## 2024-01-21 DIAGNOSIS — F17200 Nicotine dependence, unspecified, uncomplicated: Secondary | ICD-10-CM

## 2024-01-21 DIAGNOSIS — J439 Emphysema, unspecified: Secondary | ICD-10-CM

## 2024-02-21 ENCOUNTER — Encounter: Payer: Self-pay | Admitting: Internal Medicine

## 2024-03-25 ENCOUNTER — Other Ambulatory Visit: Payer: Self-pay | Admitting: Internal Medicine

## 2024-03-25 DIAGNOSIS — J439 Emphysema, unspecified: Secondary | ICD-10-CM

## 2024-03-25 DIAGNOSIS — F17200 Nicotine dependence, unspecified, uncomplicated: Secondary | ICD-10-CM

## 2024-04-29 ENCOUNTER — Telehealth: Payer: Self-pay | Admitting: Internal Medicine

## 2024-04-29 NOTE — Telephone Encounter (Signed)
 MRN: [id number]  Date: 05/21/2024 Status: Sch  Time: 1:15 PM Length: 30  Visit Type: OPEN ESTABLISHED [726] Copay: $0.00  Provider: Jackolyn Masker, MD      Copied from CRM [id number]. Topic: General - Call Back - No Documentation >> Apr 29, 2024 10:35 AM Danelle Dunning F wrote: Reason for CRM:   Patient called in stating he missed a call from the office; agent saw no documentation of a telephone encounter.  Callback Number: [phone]

## 2024-05-05 ENCOUNTER — Encounter: Admitting: Internal Medicine

## 2024-05-12 ENCOUNTER — Other Ambulatory Visit: Payer: Self-pay | Admitting: Internal Medicine

## 2024-05-12 DIAGNOSIS — J439 Emphysema, unspecified: Secondary | ICD-10-CM

## 2024-05-21 ENCOUNTER — Ambulatory Visit (INDEPENDENT_AMBULATORY_CARE_PROVIDER_SITE_OTHER): Admitting: Internal Medicine

## 2024-05-21 ENCOUNTER — Other Ambulatory Visit: Payer: Self-pay

## 2024-05-21 VITALS — BP 130/84 | HR 82 | Temp 98.7°F | Ht 70.0 in | Wt 193.4 lb

## 2024-05-21 DIAGNOSIS — G4730 Sleep apnea, unspecified: Secondary | ICD-10-CM

## 2024-05-21 DIAGNOSIS — R195 Other fecal abnormalities: Secondary | ICD-10-CM | POA: Diagnosis not present

## 2024-05-21 DIAGNOSIS — Z0000 Encounter for general adult medical examination without abnormal findings: Secondary | ICD-10-CM

## 2024-05-21 DIAGNOSIS — I10 Essential (primary) hypertension: Secondary | ICD-10-CM | POA: Diagnosis not present

## 2024-05-21 DIAGNOSIS — J439 Emphysema, unspecified: Secondary | ICD-10-CM

## 2024-05-21 DIAGNOSIS — E785 Hyperlipidemia, unspecified: Secondary | ICD-10-CM | POA: Diagnosis not present

## 2024-05-21 DIAGNOSIS — R7303 Prediabetes: Secondary | ICD-10-CM | POA: Diagnosis not present

## 2024-05-21 DIAGNOSIS — F17200 Nicotine dependence, unspecified, uncomplicated: Secondary | ICD-10-CM

## 2024-05-21 MED ORDER — STIOLTO RESPIMAT 2.5-2.5 MCG/ACT IN AERS: 2.0000 | INHALATION_SPRAY | Freq: Every day | RESPIRATORY_TRACT | 11 refills | Status: DC

## 2024-05-21 MED ORDER — STIOLTO RESPIMAT 2.5-2.5 MCG/ACT IN AERS
2.0000 | INHALATION_SPRAY | Freq: Every day | RESPIRATORY_TRACT | 11 refills | Status: AC
  Filled 2024-05-21 – 2024-06-17 (×3): qty 4, 30d supply, fill #0

## 2024-05-21 MED ORDER — ROSUVASTATIN CALCIUM 20 MG PO TABS
20.0000 mg | ORAL_TABLET | Freq: Every day | ORAL | 3 refills | Status: AC
  Filled 2024-05-21 – 2024-06-17 (×3): qty 90, 90d supply, fill #0

## 2024-05-21 MED ORDER — SPIRONOLACTONE 25 MG PO TABS
25.0000 mg | ORAL_TABLET | Freq: Every day | ORAL | 3 refills | Status: DC
  Filled 2024-05-21: qty 90, 90d supply, fill #0

## 2024-05-21 MED ORDER — ROSUVASTATIN CALCIUM 20 MG PO TABS: 20.0000 mg | ORAL_TABLET | Freq: Every day | ORAL | 3 refills | Status: DC

## 2024-05-21 MED ORDER — BUPROPION HCL ER (XL) 150 MG PO TB24
150.0000 mg | ORAL_TABLET | Freq: Two times a day (BID) | ORAL | 0 refills | Status: DC
  Filled 2024-05-21 – 2024-05-22 (×2): qty 180, 90d supply, fill #0

## 2024-05-21 NOTE — Progress Notes (Unsigned)
   CC: PCP follow up  HPI:  [name] is a 68 y.o. with medical history of HTN, HLD, emphysema, OSA, tobacco use disorder presenting to [facility] for PCP follow up.   Please see problem-based list for further details, assessments, and plans.  Past Medical History:  Diagnosis Date   COPD (chronic obstructive pulmonary disease) (HCC) 10/01/2006   Drug-induced hepatic toxicity 06/21/2007   Eczema 10/01/2006   Hyperlipidemia 10/01/2006   Hypertension 10/01/2006   Learning disability 01/07/2007   Determined in court for disability   Loss of single tooth 10/18/2022   Sleep apnea 10/01/2006   mild to moderate   Tobacco abuse 01/07/2007     Current Outpatient Medications (Cardiovascular):    Olmesartan -amLODIPine -HCTZ 40-10-25 MG TABS, TAKE 1 TABLET BY MOUTH EVERY DAY FOR HIGH BLOOD PRESSURE   metoprolol  (TOPROL -XL) 200 MG 24 hr tablet, TAKE 1 TABLET (200 MG TOTAL) BY MOUTH DAILY. TAKE WITH OR IMMEDIATELY FOLLOWING A MEAL. FOR HIGH BLOOD PRESSURE.   rosuvastatin  (CRESTOR ) 20 MG tablet, Take 1 tablet (20 mg total) by mouth daily. For high cholesterol   spironolactone  (ALDACTONE ) 25 MG tablet, Take 1 tablet (25 mg total) by mouth daily. For high blood pressure  Current Outpatient Medications (Respiratory):    albuterol  (VENTOLIN  HFA) 108 (90 Base) MCG/ACT inhaler, INHALE 1-2 PUFFS BY MOUTH EVERY 6 HOURS AS NEEDED FOR WHEEZE OR SHORTNESS OF BREATH   Tiotropium Bromide-Olodaterol (STIOLTO RESPIMAT ) 2.5-2.5 MCG/ACT AERS, Inhale 2 puffs into the lungs daily.    Current Outpatient Medications (Other):    Blood Pressure Monitoring (BLOOD PRESSURE KIT) DEVI, 1 Units by Does not apply route daily.   buPROPion  (WELLBUTRIN  XL) 150 MG 24 hr tablet, TAKE 1 TABLET BY MOUTH TWICE A DAY  Review of Systems:  Review of system negative unless stated in the problem list or HPI.    Physical Exam:  Vitals:   05/21/24 1316  BP: 130/84  Pulse: 82  Temp: 98.7 F (37.1 C)  TempSrc: Oral  SpO2: 96%   Weight: 193 lb 6.4 oz (87.7 kg)  Height: 5\' 10"  (1.778 m)   Physical Exam General: NAD HENT: NCAT Lungs: CTAB, no wheeze, rhonchi or rales.  Cardiovascular: Normal heart sounds, no r/m/g, 2+ pulses in all extremities. No LE edema Abdomen: No TTP, normal bowel sounds MSK: No asymmetry or muscle atrophy.  Skin: no lesions noted on exposed skin Neuro: Alert and oriented x4. CN grossly intact Psych: Normal mood and normal affect   Assessment & Plan:   No problem-specific Assessment & Plan notes found for this encounter.   See Encounters Tab for problem based charting.  Patient Discussed with Dr. {NAMES:[id number]::"Guilloud","Hoffman","Mullen","Narendra","Vincent","Machen","Lau","Hatcher","Williams"} Jackolyn Masker, MD Tommas Fragmin. [facility] Internal Medicine Residency, PGY-3

## 2024-05-21 NOTE — Patient Instructions (Addendum)
 Christopher Thompson, it was a pleasure seeing you today! You endorsed feeling well today. Below are some of the things we talked about this visit. We look forward to seeing you in the follow up appointment!  Today we discussed: Pick up your medicines tomorrow. Bring all of your medicine to the pharmacy so discard duplicates.   I am checking your labwork  and will call you with the results    [facility] Gastroenterologist in [place], [place]    Located in: [place] [facility] [address] Address: [address], [place], [place] [address]   Phone: [phone]  For sleep study: Call [phone] I have ordered the following labs today:  Lab Orders         BMP8+Anion Gap         Lipid Profile         Hemoglobin A1c         CBC no Diff       Referrals ordered today:   Referral Orders  No referral(s) requested today     I have ordered the following medication/changed the following medications:   Stop the following medications: Medications Discontinued During This Encounter  Medication Reason   rosuvastatin  (CRESTOR ) 20 MG tablet Reorder   Tiotropium Bromide-Olodaterol (STIOLTO RESPIMAT ) 2.5-2.5 MCG/ACT AERS Reorder   spironolactone  (ALDACTONE ) 25 MG tablet Reorder   buPROPion  (WELLBUTRIN  XL) 150 MG 24 hr tablet Reorder   Tiotropium Bromide-Olodaterol (STIOLTO RESPIMAT ) 2.5-2.5 MCG/ACT AERS Reorder   rosuvastatin  (CRESTOR ) 20 MG tablet Reorder   spironolactone  (ALDACTONE ) 25 MG tablet Discontinued by provider     Start the following medications: Meds ordered this encounter  Medications   DISCONTD: Tiotropium Bromide-Olodaterol (STIOLTO RESPIMAT ) 2.5-2.5 MCG/ACT AERS    Sig: Inhale 2 puffs into the lungs daily.    Dispense:  4 g    Refill:  11   DISCONTD: rosuvastatin  (CRESTOR ) 20 MG tablet    Sig: Take 1 tablet (20 mg total) by mouth daily. For high cholesterol    Dispense:  90 tablet    Refill:  3   buPROPion   (WELLBUTRIN  XL) 150 MG 24 hr tablet    Sig: Take 1 tablet (150 mg total) by mouth 2 (two) times daily. Start by taking one tablet for the first week.    Dispense:  180 tablet    Refill:  0   Tiotropium Bromide-Olodaterol (STIOLTO RESPIMAT ) 2.5-2.5 MCG/ACT AERS    Sig: Inhale 2 puffs into the lungs daily.    Dispense:  4 g    Refill:  11   rosuvastatin  (CRESTOR ) 20 MG tablet    Sig: Take 1 tablet (20 mg total) by mouth daily. For high cholesterol    Dispense:  90 tablet    Refill:  3   DISCONTD: spironolactone  (ALDACTONE ) 25 MG tablet    Sig: Take 1 tablet (25 mg total) by mouth daily. For high blood pressure    Dispense:  90 tablet    Refill:  3    DX Code Needed  .     Follow-up: 4 week follow up   Please make sure to arrive 15 minutes prior to your next appointment. If you arrive late, you may be asked to reschedule.   We look forward to seeing you next time. Please call our clinic at [phone] if you have any questions or concerns. The best time to call is Monday-Friday from 9am-4pm, but there is someone available 24/7. If  after hours or the weekend, call the main hospital number and ask for the Internal Medicine Resident On-Call. If you need medication refills, please notify your pharmacy one week in advance and they will send us  a request.  Thank you for letting us  take part in your care. Wishing you the best!  Thank you, Jackolyn Masker, MD

## 2024-05-22 ENCOUNTER — Other Ambulatory Visit: Payer: Self-pay

## 2024-05-22 ENCOUNTER — Encounter: Payer: Self-pay | Admitting: Internal Medicine

## 2024-05-22 LAB — CBC
Hematocrit: 45.6 % (ref 37.5–51.0)
Hemoglobin: 15.2 g/dL (ref 13.0–17.7)
MCH: 31.4 pg (ref 26.6–33.0)
MCHC: 33.3 g/dL (ref 31.5–35.7)
MCV: 94 fL (ref 79–97)
Platelets: 159 10*3/uL (ref 150–450)
RBC: 4.84 x10E6/uL (ref 4.14–5.80)
RDW: 13.7 % (ref 11.6–15.4)
WBC: 6 10*3/uL (ref 3.4–10.8)

## 2024-05-22 LAB — LIPID PANEL
Chol/HDL Ratio: 4.6 ratio (ref 0.0–5.0)
Cholesterol, Total: 216 mg/dL — ABNORMAL HIGH (ref 100–199)
HDL: 47 mg/dL (ref >39)
LDL Chol Calc (NIH): 154 mg/dL — ABNORMAL HIGH (ref 0–99)
Triglycerides: 84 mg/dL (ref 0–149)
VLDL Cholesterol Cal: 15 mg/dL (ref 5–40)

## 2024-05-22 LAB — BMP8+ANION GAP
Anion Gap: 15 mmol/L (ref 10.0–18.0)
BUN/Creatinine Ratio: 11 (ref 10–24)
BUN: 13 mg/dL (ref 8–27)
CO2: 19 mmol/L — ABNORMAL LOW (ref 20–29)
Calcium: 9.6 mg/dL (ref 8.6–10.2)
Chloride: 103 mmol/L (ref 96–106)
Creatinine, Ser: 1.22 mg/dL (ref 0.76–1.27)
Glucose: 99 mg/dL (ref 70–99)
Potassium: 4.1 mmol/L (ref 3.5–5.2)
Sodium: 137 mmol/L (ref 134–144)
eGFR: 65 mL/min/{1.73_m2} (ref >59)

## 2024-05-22 LAB — HEMOGLOBIN A1C
Est. average glucose Bld gHb Est-mCnc: 120 mg/dL
Hgb A1c MFr Bld: 5.8 % — ABNORMAL HIGH (ref 4.8–5.6)

## 2024-05-22 NOTE — Assessment & Plan Note (Addendum)
 Needs colonoscopy and this was extensively discussed with the patient. Will also discuss with his son as well. Obtained CBC that shows no anemia which is reassuring. Follow up in one month.

## 2024-05-22 NOTE — Assessment & Plan Note (Signed)
 Pt with HLD who is on crestor . LDL checked and elevated at 154. Pt not taking crestor  and this was last filled 09/2023. Will have him take this regularly from now on.

## 2024-05-22 NOTE — Assessment & Plan Note (Signed)
 Well controlled on current inhalers/

## 2024-05-22 NOTE — Assessment & Plan Note (Signed)
 Resolved on repeat BMP.

## 2024-05-22 NOTE — Assessment & Plan Note (Signed)
 A1c checked and is 5.8. Repeat yearly.

## 2024-05-22 NOTE — Assessment & Plan Note (Addendum)
 Pt with HTN that is well controlled. He is taking Olmesartan -amlodipine -hydrochlorothiazide  40-10-25 mg daily. He is taking spirinolactone but not consistently. BMP previously showed hypercalcemia but repeat shows normal calcium  level. Will have him continue triple med combo and discontinue the spironolactone . Follow up in 1 month.

## 2024-05-22 NOTE — Assessment & Plan Note (Signed)
 Pt still does not have CPAP machine. He would like sleep medicine's phone number as they have reached out to him previously. Will provide him with the number and make his son aware as well.

## 2024-05-22 NOTE — Progress Notes (Signed)
 Internal Medicine Clinic Attending  Case discussed with the resident at the time of the visit.  We reviewed the resident's history and exam and pertinent patient test results.  I agree with the assessment, diagnosis, and plan of care documented in the resident's note.

## 2024-05-26 ENCOUNTER — Ambulatory Visit: Payer: Self-pay | Admitting: Internal Medicine

## 2024-06-04 ENCOUNTER — Other Ambulatory Visit: Payer: Self-pay | Admitting: Internal Medicine

## 2024-06-04 DIAGNOSIS — J439 Emphysema, unspecified: Secondary | ICD-10-CM

## 2024-06-04 DIAGNOSIS — F17200 Nicotine dependence, unspecified, uncomplicated: Secondary | ICD-10-CM

## 2024-06-04 DIAGNOSIS — I10 Essential (primary) hypertension: Secondary | ICD-10-CM

## 2024-06-09 ENCOUNTER — Encounter: Payer: Self-pay | Admitting: *Deleted

## 2024-06-17 ENCOUNTER — Other Ambulatory Visit: Payer: Self-pay

## 2024-07-01 ENCOUNTER — Other Ambulatory Visit: Payer: Self-pay | Admitting: Internal Medicine

## 2024-07-01 DIAGNOSIS — J439 Emphysema, unspecified: Secondary | ICD-10-CM

## 2024-07-01 NOTE — Telephone Encounter (Signed)
 Medication discontinued 11/05/23

## 2024-07-22 NOTE — Procedures (Signed)
 Christopher Thompson

## 2024-07-27 ENCOUNTER — Other Ambulatory Visit: Payer: Self-pay | Admitting: Internal Medicine

## 2024-07-27 DIAGNOSIS — J439 Emphysema, unspecified: Secondary | ICD-10-CM

## 2024-07-28 NOTE — Telephone Encounter (Signed)
 Medication discontinued 11/05/23

## 2024-09-15 ENCOUNTER — Other Ambulatory Visit (HOSPITAL_BASED_OUTPATIENT_CLINIC_OR_DEPARTMENT_OTHER): Payer: Self-pay

## 2024-10-18 ENCOUNTER — Other Ambulatory Visit: Payer: Self-pay | Admitting: Internal Medicine

## 2024-10-18 DIAGNOSIS — J439 Emphysema, unspecified: Secondary | ICD-10-CM

## 2024-10-18 DIAGNOSIS — F17200 Nicotine dependence, unspecified, uncomplicated: Secondary | ICD-10-CM

## 2024-10-23 ENCOUNTER — Other Ambulatory Visit: Payer: Self-pay | Admitting: Internal Medicine

## 2024-10-23 DIAGNOSIS — J439 Emphysema, unspecified: Secondary | ICD-10-CM

## 2024-10-23 NOTE — Telephone Encounter (Signed)
 Copied from CRM #[id number]. Topic: Clinical - Medication Refill >> Oct 23, 2024 10:38 AM Debby BROCKS wrote: Medication: albuterol  (VENTOLIN  HFA) 108 (90 Base) MCG/ACT inhaler  Has the patient contacted their pharmacy? Yes (Agent: If no, request that the patient contact the pharmacy for the refill. If patient does not wish to contact the pharmacy document the reason why and proceed with request.) (Agent: If yes, when and what did the pharmacy advise?)  Patient contacted pharmacy and was advised that he needed to contact his PCP's location as they already sent a fax for the refill  This is the patient's preferred pharmacy:  [id number] - [place], [place] - [address] AT [facility] [address] [place] [address] Phone: [phone] Fax: [phone]  Is this the correct pharmacy for this prescription? Yes If no, delete pharmacy and type the correct one.   Has the prescription been filled recently? Yes  Is the patient out of the medication? No  Has the patient been seen for an appointment in the last year OR does the patient have an upcoming appointment? Yes  Can we respond through MyChart? Yes  Agent: Please be advised that Rx refills may take up to 3 business days. We ask that you follow-up with your pharmacy.

## 2024-10-28 ENCOUNTER — Other Ambulatory Visit: Payer: Self-pay | Admitting: *Deleted

## 2024-10-30 MED ORDER — ALBUTEROL SULFATE HFA 108 (90 BASE) MCG/ACT IN AERS: 1.0000 | INHALATION_SPRAY | Freq: Four times a day (QID) | RESPIRATORY_TRACT | 5 refills | Status: AC | PRN

## 2024-10-30 NOTE — Telephone Encounter (Signed)
 Pt called / informed of refill.

## 2024-10-30 NOTE — Telephone Encounter (Signed)
 Pt was here at the office asking about Albuterol  refill; which was requested since 10/23/24. Sending to the doctor again.

## 2024-11-27 ENCOUNTER — Other Ambulatory Visit: Payer: Self-pay

## 2024-11-27 ENCOUNTER — Ambulatory Visit: Admitting: Student

## 2024-11-27 VITALS — BP 131/76 | HR 76 | Temp 98.4°F | Ht 70.0 in | Wt 198.8 lb

## 2024-11-27 DIAGNOSIS — F17200 Nicotine dependence, unspecified, uncomplicated: Secondary | ICD-10-CM

## 2024-11-27 DIAGNOSIS — G4730 Sleep apnea, unspecified: Secondary | ICD-10-CM

## 2024-11-27 DIAGNOSIS — E785 Hyperlipidemia, unspecified: Secondary | ICD-10-CM

## 2024-11-27 DIAGNOSIS — Z0000 Encounter for general adult medical examination without abnormal findings: Secondary | ICD-10-CM

## 2024-11-27 DIAGNOSIS — R7303 Prediabetes: Secondary | ICD-10-CM

## 2024-11-27 DIAGNOSIS — R195 Other fecal abnormalities: Secondary | ICD-10-CM

## 2024-11-27 DIAGNOSIS — Z23 Encounter for immunization: Secondary | ICD-10-CM

## 2024-11-27 DIAGNOSIS — I10 Essential (primary) hypertension: Secondary | ICD-10-CM

## 2024-11-27 MED ORDER — NICOTINE 7 MG/24HR TD PT24: 7.0000 mg | MEDICATED_PATCH | TRANSDERMAL | 2 refills | Status: AC

## 2024-11-27 NOTE — Patient Instructions (Addendum)
 Thank you, Christopher Thompson for allowing us  to provide your care today. Today we discussed your overall health   I am glad you are taking your medication as prescribed.You have also agreed to get the colonoscopy done after our discussion . They will call you to arrange that .  Also, I am prescribing  the nicotine  patch. I will follow up with you in 6 week to see how you are doing.   I have ordered the following labs for you:  Lab Orders         Lipid Profile      Tests ordered today:    Referrals ordered today:   Referral Orders         Ambulatory referral to Gastroenterology      I have ordered the following medication/changed the following medications:   Stop the following medications: There are no discontinued medications.   Start the following medications: Meds ordered this encounter  Medications   nicotine  (NICODERM CQ  - DOSED IN MG/24 HR) 7 mg/24hr patch    Sig: Place 1 patch (7 mg total) onto the skin daily.    Dispense:  30 patch    Refill:  2     Follow up: 6 week   Remember:   Should you have any questions or concerns please call the internal medicine clinic at [phone].   Drue Lisa Grow MD 11/27/2024, 2:06 PM   [facility]

## 2024-11-27 NOTE — Progress Notes (Signed)
 CC: Routine office visit  LOV was May 28th  HPI:  Christopher Thompson is a 68 y.o. male living with a history stated below and presents today for routine office visit . Please see problem based assessment and plan for additional details.  Past Medical History:  Diagnosis Date   COPD (chronic obstructive pulmonary disease) (HCC) 10/01/2006   Drug-induced hepatic toxicity 06/21/2007   Eczema 10/01/2006   Hypercalcemia 11/07/2023   Ca 10.5 11/24  Recheck BMP at f/u     Hyperlipidemia 10/01/2006   Hypertension 10/01/2006   Learning disability 01/07/2007   Determined in court for disability   Loss of single tooth 10/18/2022   Sleep apnea 10/01/2006   mild to moderate   Tobacco abuse 01/07/2007    Current Outpatient Medications on File Prior to Visit  Medication Sig Dispense Refill   albuterol  (VENTOLIN  HFA) 108 (90 Base) MCG/ACT inhaler Inhale 1-2 puffs into the lungs every 6 (six) hours as needed for wheezing or shortness of breath. 6.7 each 5   Blood Pressure Monitoring (BLOOD PRESSURE KIT) DEVI 1 Units by Does not apply route daily. 1 each 0   metoprolol  (TOPROL -XL) 200 MG 24 hr tablet TAKE 1 TABLET (200 MG TOTAL) BY MOUTH DAILY. TAKE WITH OR IMMEDIATELY FOLLOWING A MEAL. FOR HIGH BLOOD PRESSURE. 90 tablet 3   Olmesartan -amLODIPine -HCTZ 40-10-25 MG TABS TAKE 1 TABLET BY MOUTH EVERY DAY FOR HIGH BLOOD PRESSURE 90 tablet 1   rosuvastatin  (CRESTOR ) 20 MG tablet Take 1 tablet (20 mg total) by mouth daily. For high cholesterol 90 tablet 3   Tiotropium Bromide-Olodaterol (STIOLTO RESPIMAT ) 2.5-2.5 MCG/ACT AERS Inhale 2 puffs into the lungs daily. 4 g 11   No current facility-administered medications on file prior to visit.    Family History  Problem Relation Age of Onset   Hypertension Mother    Alzheimer's disease Mother    Heart disease Father    Heart attack Father    Hypertension Father    Hypertension Sister    Heart disease Brother    Hypertension Son    Hypertension  Sister     Social History   Socioeconomic History   Marital status: Legally Separated    Spouse name: Not on file   Number of children: Not on file   Years of education: 7th grade   Highest education level: Not on file  Occupational History   Occupation: disability    Comment: due to learning disability per patient; can only do physical work, and hurt his left shoulder on the job years ago  Tobacco Use   Smoking status: Some Days    Current packs/day: 0.00    Average packs/day: 0.1 packs/day for 45.0 years (4.5 ttl pk-yrs)    Types: Cigarettes    Start date: 09/19/1969    Last attempt to quit: 09/19/2014    Years since quitting: 10.2   Smokeless tobacco: Never   Tobacco comments:    0-3 cigs per day  Vaping Use   Vaping status: Never Used  Substance and Sexual Activity   Alcohol use: No    Alcohol/week: 0.0 standard drinks of alcohol   Drug use: No   Sexual activity: Yes    Birth control/protection: None    Comment: Committed relationship x 8 years  Other Topics Concern   Not on file  Social History Narrative   Current Social History 07/22/2019        Patient lives alone in an apartment which is on the second floor.  There are steps with handrails up to the entrance the patient uses.       Patient's method of transportation is personal car.      The highest level of education was 7th grade      The patient currently disabled.      Identified important Relationships are My friend, Fronie.       Pets : None       Interests / Fun: Stay at home and watch TV. Walk every morning.       Current Stressors: I really don't have any.       Religious / Personal Beliefs: Baptist       L. Jori, RN, BSN       Social Drivers of Health   Financial Resource Strain: Low Risk  (05/21/2024)   Overall Financial Resource Strain (CARDIA)    Difficulty of Paying Living Expenses: Not hard at all  Food Insecurity: No Food Insecurity (05/21/2024)   Hunger Vital Sign    Worried  About Running Out of Food in the Last Year: Never true    Ran Out of Food in the Last Year: Never true  Transportation Needs: No Transportation Needs (05/21/2024)   PRAPARE - Administrator, Civil Service (Medical): No    Lack of Transportation (Non-Medical): No  Physical Activity: Sufficiently Active (05/21/2024)   Exercise Vital Sign    Days of Exercise per Week: 7 days    Minutes of Exercise per Session: 40 min  Stress: No Stress Concern Present (05/21/2024)   [employer] of Occupational Health - Occupational Stress Questionnaire    Feeling of Stress : Not at all  Social Connections: Socially Isolated (05/21/2024)   Social Connection and Isolation Panel    Frequency of Communication with Friends and Family: More than three times a week    Frequency of Social Gatherings with Friends and Family: More than three times a week    Attends Religious Services: Never    Database Administrator or Organizations: No    Attends Banker Meetings: Never    Marital Status: Separated  Intimate Partner Violence: Unknown (05/21/2024)   Humiliation, Afraid, Rape, and Kick questionnaire    Fear of Current or Ex-Partner: No    Emotionally Abused: No    Physically Abused: No    Sexually Abused: Not on file    Review of Systems: ROS negative except for what is noted on the assessment and plan.  Vitals:   11/27/24 1312 11/27/24 1331  BP: (!) 148/81 131/76  Pulse: 81 76  Temp: 98.4 F (36.9 C)   TempSrc: Oral   SpO2: 99%   Weight: 198 lb 12.8 oz (90.2 kg)   Height: 5' 10 (1.778 m)     Physical Exam: Constitutional: well-appearing man,  sitting in chair , in no acute distress Cardiovascular: regular rate and rhythm, no m/r/g Pulmonary/Chest: normal work of breathing on room air Skin: warm and dry Psych: normal mood and behavior  Assessment & Plan:   Essential hypertension BP Readings from Last 3 Encounters:  11/27/24 131/76  05/21/24 130/84  11/05/23 115/82   Mr. Hudspeth returns to the clinic for follow-up of his hypertension and other chronic conditions; his repeat blood pressure today is 131/76, and he reports taking his  doses of olmesartan -amlodipine -HCTZ and metoprolol , which he is tolerating well without dizziness or syncope; I spent approximately 8 minutes counseling him on the importance of medication adherence, particularly given his tobacco use and  elevated cardiovascular risk, and he expressed understanding; no medication dose adjustments are indicated at this time. - Continue olmesartan -amlodipine -HCTZ 40 - 10 - 25 mg tabs daily -Continue metoprolol  200 mg daily   Tobacco use disorder The patient continues to smoke 7-8 cigarettes per day with the strongest urge upon waking; he previously trialed bupropion  but it was discontinued because he felt it increased his urge to smoke, and as noted in the hypertension section, approximately 8 minutes were spent counseling him on smoking cessation and the associated elevated cardiovascular and cancer risks, which he acknowledged understanding; we reviewed evidence-based cessation options, and through shared decision-making he elected to initiate nicotine  patches and will report back on his response.  - Prescribe nicotine  patch 7mg /24hrs  Occult blood positive stool Given a history of a positive Hemoccult test in this 68 year old patient, we reviewed the importance of colon cancer screening; although he previously declined colonoscopy in 2024 despite prior discussion with the office, we revisited available options--including Cologuard--and after thorough counseling he agreed to proceed with colonoscopy at this time, so a GI referral will be placed with the expectation that Mr. Gopaul will follow through.  Preventative health care Flu shot given today GI referral sent for colon cancer screening   HLD (hyperlipidemia) He had an LDL of 154 in May 2025 and has been on rosuvastatin  20 mg since March 2023.  His LDL levels had previously been well controlled, but he reported taking the medication only sparingly prior to May, which likely contributed to the increase. Since he has resumed consistent use, I will recheck his lipid panel today to assess adherence and treatment response. - Lipid Panel     Patient discussed with Dr. CHARLENA Rosan Drue Renne, M.D [facility] Internal [facility] Phone: [phone] Date 11/28/2024 Time 8:20 AM

## 2024-11-28 LAB — LIPID PANEL
Chol/HDL Ratio: 4 ratio (ref 0.0–5.0)
Cholesterol, Total: 170 mg/dL (ref 100–199)
HDL: 43 mg/dL (ref >39)
LDL Chol Calc (NIH): 103 mg/dL — ABNORMAL HIGH (ref 0–99)
Triglycerides: 136 mg/dL (ref 0–149)
VLDL Cholesterol Cal: 24 mg/dL (ref 5–40)

## 2024-11-28 NOTE — Assessment & Plan Note (Addendum)
 BP Readings from Last 3 Encounters:  11/27/24 131/76  05/21/24 130/84  11/05/23 115/82  Christopher Thompson returns to the clinic for follow-up of his hypertension and other chronic conditions; his repeat blood pressure today is 131/76, and he reports taking his  doses of olmesartan -amlodipine -HCTZ and metoprolol , which he is tolerating well without dizziness or syncope; I spent approximately 8 minutes counseling him on the importance of medication adherence, particularly given his tobacco use and elevated cardiovascular risk, and he expressed understanding; no medication dose adjustments are indicated at this time. - Continue olmesartan -amlodipine -HCTZ 40 - 10 - 25 mg tabs daily -Continue metoprolol  200 mg daily

## 2024-11-28 NOTE — Assessment & Plan Note (Signed)
 The patient continues to smoke 7-8 cigarettes per day with the strongest urge upon waking; he previously trialed bupropion  but it was discontinued because he felt it increased his urge to smoke, and as noted in the hypertension section, approximately 8 minutes were spent counseling him on smoking cessation and the associated elevated cardiovascular and cancer risks, which he acknowledged understanding; we reviewed evidence-based cessation options, and through shared decision-making he elected to initiate nicotine  patches and will report back on his response.  - Prescribe nicotine  patch 7mg schuyler

## 2024-11-28 NOTE — Assessment & Plan Note (Addendum)
 Given a history of a positive Hemoccult test in this 68 year old patient, we reviewed the importance of colon cancer screening; although he previously declined colonoscopy in 2024 despite prior discussion with the office, we revisited available options--including Cologuard--and after thorough counseling he agreed to proceed with colonoscopy at this time, so a GI referral will be placed with the expectation that Christopher Thompson will follow through.

## 2024-11-28 NOTE — Assessment & Plan Note (Signed)
 Flu shot given today GI referral sent for colon cancer screening

## 2024-11-28 NOTE — Assessment & Plan Note (Signed)
 He had an LDL of 154 in May 2025 and has been on rosuvastatin  20 mg since March 2023. His LDL levels had previously been well controlled, but he reported taking the medication only sparingly prior to May, which likely contributed to the increase. Since he has resumed consistent use, I will recheck his lipid panel today to assess adherence and treatment response. - Lipid Panel

## 2024-12-05 NOTE — Progress Notes (Signed)
 Internal Medicine Clinic Attending  Case discussed with the resident at the time of the visit.  We reviewed the resident's history and exam and pertinent patient test results.  I agree with the assessment, diagnosis, and plan of care documented in the resident's note.
